# Patient Record
Sex: Male | Born: 1935 | Race: White | Hispanic: No | Marital: Married | Smoking: Former smoker
Health system: Southern US, Community
[De-identification: ages and names within clinical notes are randomized; demographics above are authoritative.]

## PROBLEM LIST (undated history)

## (undated) DIAGNOSIS — I10 Essential (primary) hypertension: Secondary | ICD-10-CM

## (undated) DIAGNOSIS — I2510 Atherosclerotic heart disease of native coronary artery without angina pectoris: Secondary | ICD-10-CM

## (undated) DIAGNOSIS — E039 Hypothyroidism, unspecified: Secondary | ICD-10-CM

## (undated) DIAGNOSIS — I219 Acute myocardial infarction, unspecified: Secondary | ICD-10-CM

## (undated) DIAGNOSIS — K219 Gastro-esophageal reflux disease without esophagitis: Secondary | ICD-10-CM

## (undated) DIAGNOSIS — E785 Hyperlipidemia, unspecified: Secondary | ICD-10-CM

## (undated) DIAGNOSIS — C189 Malignant neoplasm of colon, unspecified: Secondary | ICD-10-CM

## (undated) DIAGNOSIS — N200 Calculus of kidney: Secondary | ICD-10-CM

## (undated) DIAGNOSIS — Z9289 Personal history of other medical treatment: Secondary | ICD-10-CM

## (undated) DIAGNOSIS — Z9862 Peripheral vascular angioplasty status: Secondary | ICD-10-CM

## (undated) HISTORY — DX: Essential (primary) hypertension: I10

## (undated) HISTORY — DX: Hypothyroidism, unspecified: E03.9

## (undated) HISTORY — DX: Peripheral vascular angioplasty status: Z98.62

## (undated) HISTORY — PX: ROTATOR CUFF REPAIR: SHX139

## (undated) HISTORY — DX: Gastro-esophageal reflux disease without esophagitis: K21.9

## (undated) HISTORY — DX: Acute myocardial infarction, unspecified: I21.9

## (undated) HISTORY — DX: Atherosclerotic heart disease of native coronary artery without angina pectoris: I25.10

## (undated) HISTORY — DX: Hyperlipidemia, unspecified: E78.5

## (undated) HISTORY — DX: Malignant neoplasm of colon, unspecified: C18.9

## (undated) HISTORY — DX: Personal history of other medical treatment: Z92.89

## (undated) HISTORY — PX: CORONARY ARTERY BYPASS GRAFT: SHX141

## (undated) HISTORY — DX: Calculus of kidney: N20.0

---

## 1989-07-29 DIAGNOSIS — Z9862 Peripheral vascular angioplasty status: Secondary | ICD-10-CM

## 1989-07-29 DIAGNOSIS — I219 Acute myocardial infarction, unspecified: Secondary | ICD-10-CM

## 1989-07-29 HISTORY — DX: Peripheral vascular angioplasty status: Z98.62

## 1989-07-29 HISTORY — DX: Acute myocardial infarction, unspecified: I21.9

## 1991-11-29 HISTORY — PX: CARDIAC CATHETERIZATION: SHX172

## 1997-09-29 ENCOUNTER — Other Ambulatory Visit: Admission: RE | Admit: 1997-09-29 | Discharge: 1997-09-29 | Payer: Self-pay | Admitting: Internal Medicine

## 1999-08-07 ENCOUNTER — Ambulatory Visit (HOSPITAL_COMMUNITY): Admission: RE | Admit: 1999-08-07 | Discharge: 1999-08-07 | Payer: Self-pay | Admitting: Gastroenterology

## 2000-02-09 ENCOUNTER — Ambulatory Visit (HOSPITAL_COMMUNITY): Admission: RE | Admit: 2000-02-09 | Discharge: 2000-02-09 | Payer: Self-pay | Admitting: Gastroenterology

## 2000-04-30 HISTORY — PX: KNEE SURGERY: SHX244

## 2000-04-30 HISTORY — PX: OTHER SURGICAL HISTORY: SHX169

## 2001-01-28 ENCOUNTER — Ambulatory Visit (HOSPITAL_COMMUNITY): Admission: RE | Admit: 2001-01-28 | Discharge: 2001-01-28 | Payer: Self-pay | Admitting: Gastroenterology

## 2001-02-19 DIAGNOSIS — C189 Malignant neoplasm of colon, unspecified: Secondary | ICD-10-CM

## 2001-02-19 HISTORY — DX: Malignant neoplasm of colon, unspecified: C18.9

## 2001-02-19 HISTORY — PX: COLON SURGERY: SHX602

## 2002-02-23 ENCOUNTER — Ambulatory Visit (HOSPITAL_COMMUNITY): Admission: RE | Admit: 2002-02-23 | Discharge: 2002-02-23 | Payer: Self-pay | Admitting: Gastroenterology

## 2003-04-12 ENCOUNTER — Ambulatory Visit (HOSPITAL_COMMUNITY): Admission: RE | Admit: 2003-04-12 | Discharge: 2003-04-12 | Payer: Self-pay | Admitting: Internal Medicine

## 2004-12-13 ENCOUNTER — Emergency Department (HOSPITAL_COMMUNITY): Admission: EM | Admit: 2004-12-13 | Discharge: 2004-12-13 | Payer: Self-pay | Admitting: Emergency Medicine

## 2004-12-19 ENCOUNTER — Inpatient Hospital Stay (HOSPITAL_COMMUNITY): Admission: AD | Admit: 2004-12-19 | Discharge: 2004-12-25 | Payer: Self-pay | Admitting: Cardiovascular Disease

## 2004-12-20 HISTORY — PX: CARDIAC CATHETERIZATION: SHX172

## 2005-01-12 ENCOUNTER — Encounter: Admission: RE | Admit: 2005-01-12 | Discharge: 2005-01-12 | Payer: Self-pay | Admitting: Cardiothoracic Surgery

## 2005-01-18 ENCOUNTER — Encounter (HOSPITAL_COMMUNITY): Admission: RE | Admit: 2005-01-18 | Discharge: 2005-04-18 | Payer: Self-pay | Admitting: Cardiovascular Disease

## 2005-03-30 DIAGNOSIS — Z9289 Personal history of other medical treatment: Secondary | ICD-10-CM

## 2005-03-30 HISTORY — DX: Personal history of other medical treatment: Z92.89

## 2005-04-19 ENCOUNTER — Encounter (HOSPITAL_COMMUNITY): Admission: RE | Admit: 2005-04-19 | Discharge: 2005-05-20 | Payer: Self-pay | Admitting: Cardiovascular Disease

## 2005-06-12 ENCOUNTER — Ambulatory Visit (HOSPITAL_COMMUNITY): Admission: RE | Admit: 2005-06-12 | Discharge: 2005-06-12 | Payer: Self-pay | Admitting: Gastroenterology

## 2006-05-13 IMAGING — CR DG CHEST 1V PORT
1 series · 1 of 1 positions shown · non-contrast
Comparison: 12/20/04.

CLINICAL DATA: Post CABG for chest pain. 
 PORTABLE CHEST ? 1 VIEW:

[view not recorded]
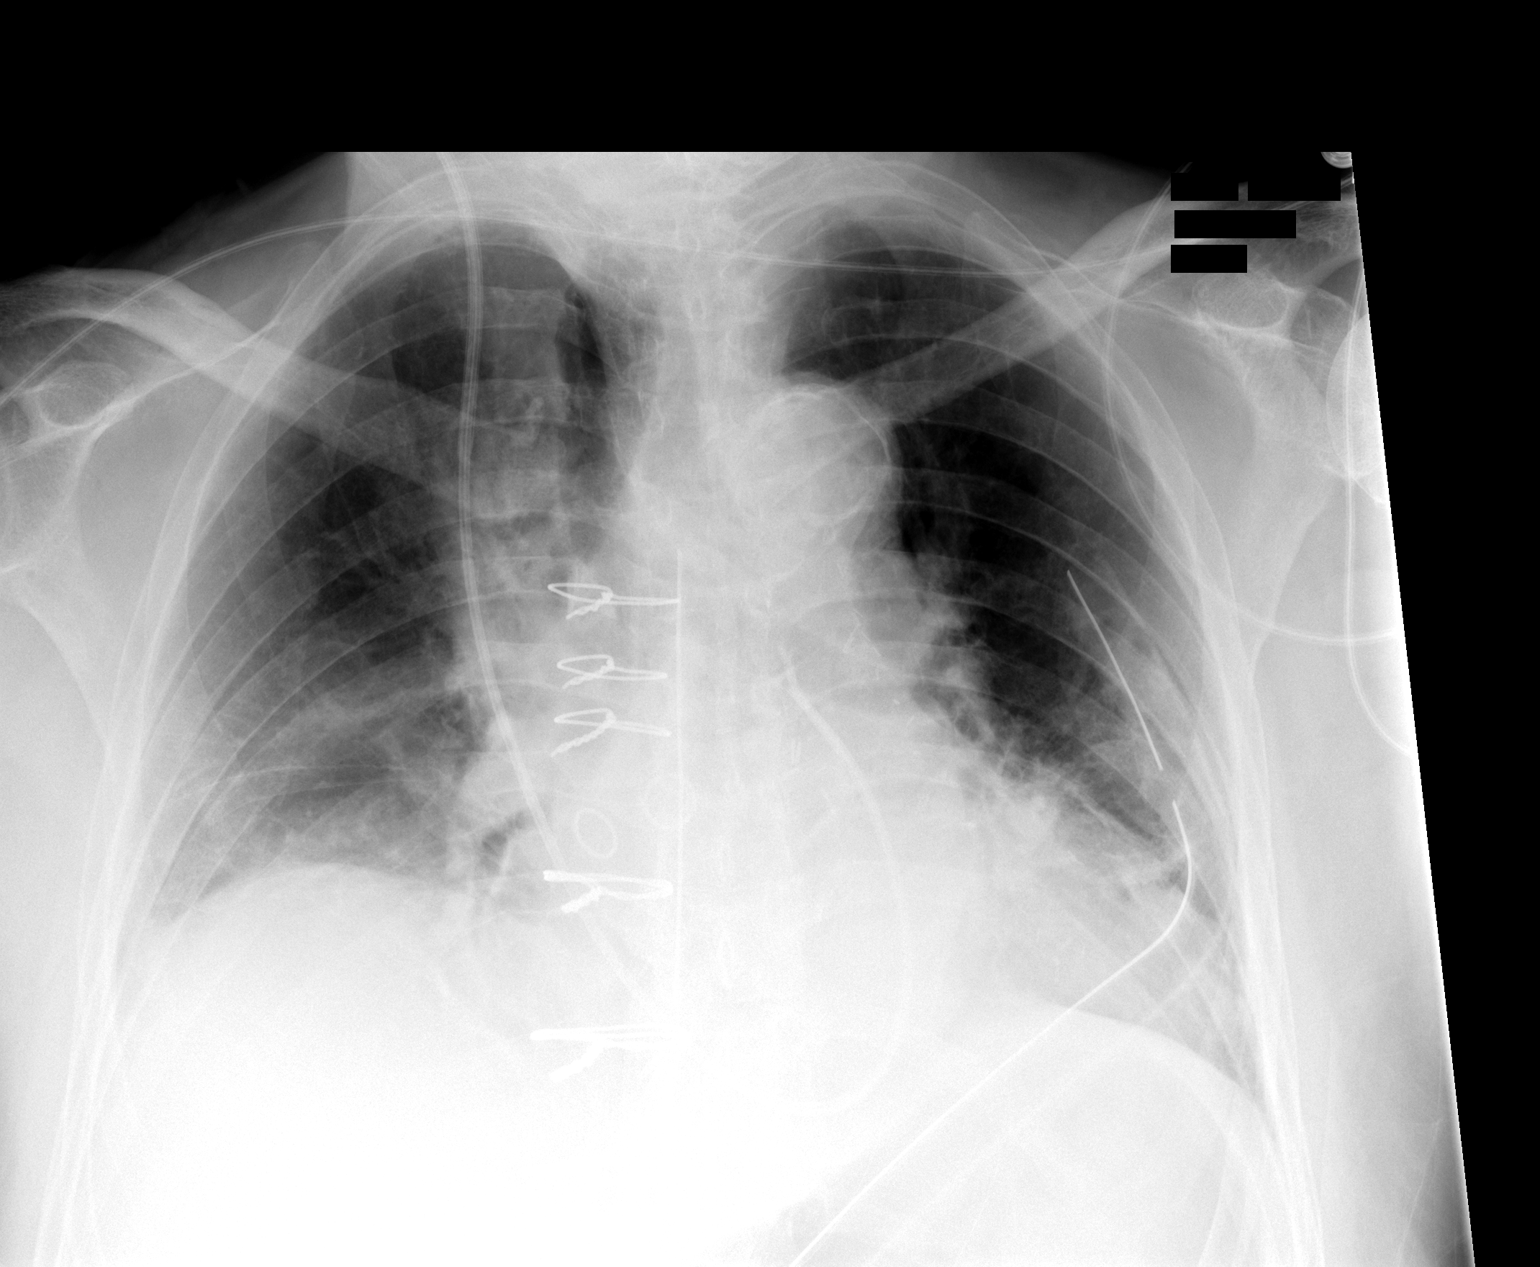

[1 of 1 positions shown; findings below may reference images not displayed]

FINDINGS: ET tube removed.  Other tubes and lines remain in place.  No definite right apical patient as was noted yesterday.
 Moderate bibasilar atelectasis postoperatively.  No definite heart failure.
IMPRESSION: 1.  ET tube removed. 
 2.  No definite pneumothorax. 
 3.  Moderate bibasilar postoperative atelectasis.

## 2006-05-14 IMAGING — CR DG CHEST 2V
2 series · 2 of 2 positions shown · non-contrast
Comparison: 12/21/04.

CLINICAL DATA: Status post CABG.
 CHEST ? 2 VIEW:

[view not recorded (1 of 2)]
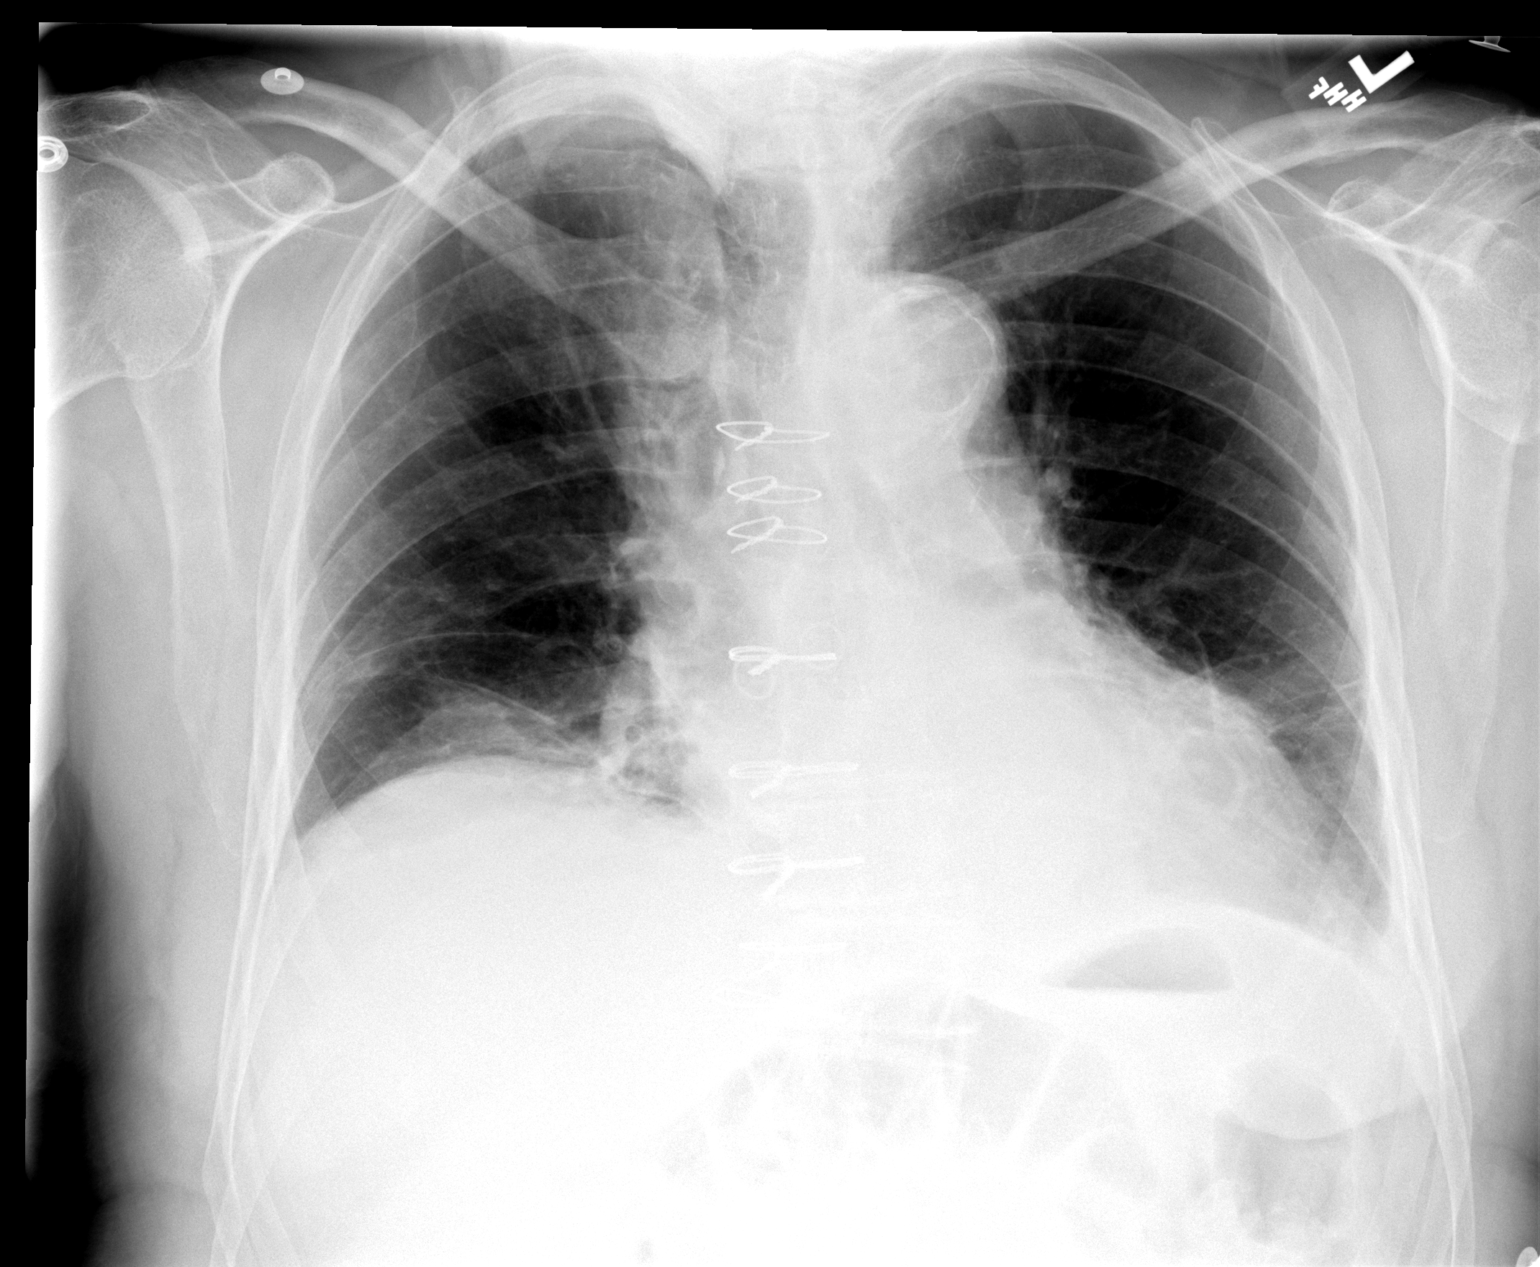

[view not recorded (2 of 2)]
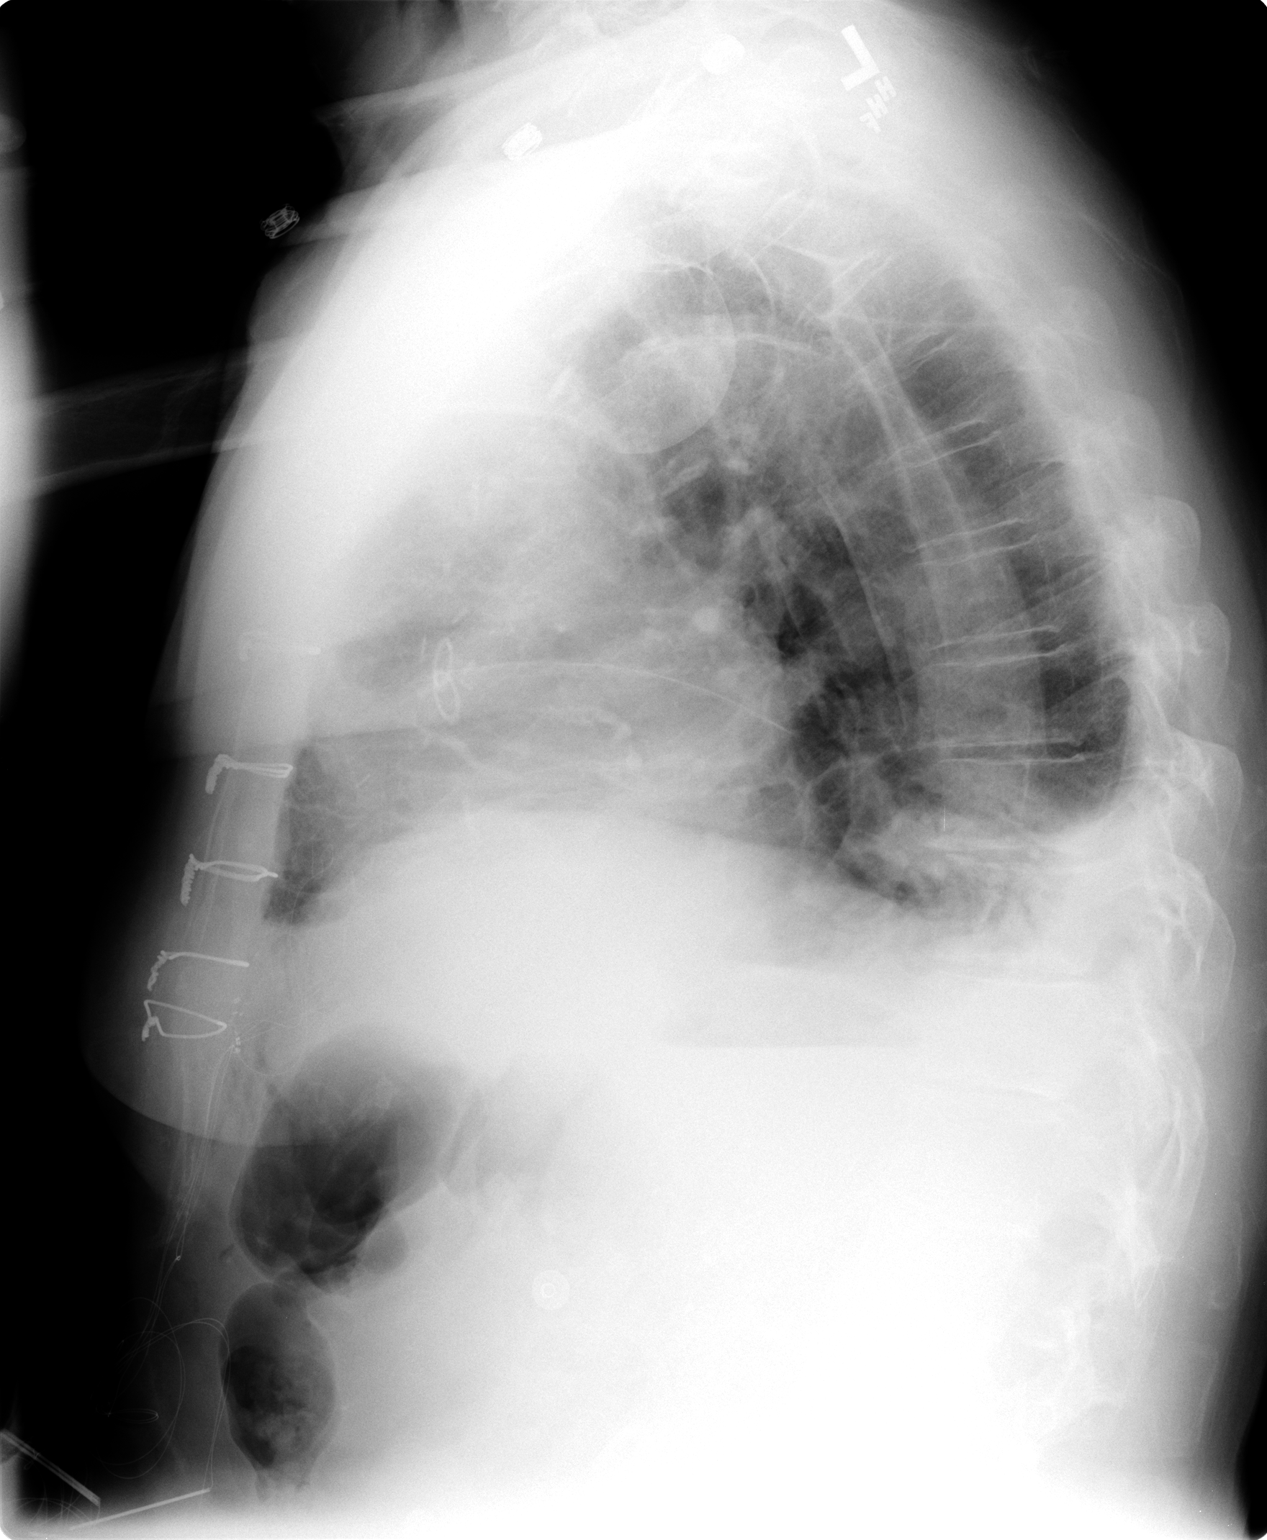

[2 of 2 positions shown; findings below may reference images not displayed]

FINDINGS: The patient is status post median sternotomy for CABG.  The Swan-Ganz catheter, mediastinal drain, and left-sided chest tube have all been removed.  Lung volumes remain low, accentuating the heart size.  Epicardial pacing wires remain in place.  There is some residual atelectasis at both lung bases.  A small amount of pleural fluid remain on the left.
IMPRESSION: 1.  Status post removal of support apparatus without complication.
 2.  Residual bibasilar atelectasis and left pleural effusion.

## 2009-09-28 DIAGNOSIS — Z9289 Personal history of other medical treatment: Secondary | ICD-10-CM

## 2009-09-28 HISTORY — DX: Personal history of other medical treatment: Z92.89

## 2010-07-31 LAB — HM COLONOSCOPY

## 2011-05-08 DIAGNOSIS — C44319 Basal cell carcinoma of skin of other parts of face: Secondary | ICD-10-CM | POA: Diagnosis not present

## 2011-05-31 DIAGNOSIS — E782 Mixed hyperlipidemia: Secondary | ICD-10-CM | POA: Diagnosis not present

## 2011-05-31 DIAGNOSIS — I10 Essential (primary) hypertension: Secondary | ICD-10-CM | POA: Diagnosis not present

## 2011-05-31 DIAGNOSIS — Z79899 Other long term (current) drug therapy: Secondary | ICD-10-CM | POA: Diagnosis not present

## 2011-05-31 DIAGNOSIS — E559 Vitamin D deficiency, unspecified: Secondary | ICD-10-CM | POA: Diagnosis not present

## 2011-06-14 DIAGNOSIS — H353190 Nonexudative age-related macular degeneration, unspecified eye, stage unspecified: Secondary | ICD-10-CM | POA: Diagnosis not present

## 2011-06-14 DIAGNOSIS — H409 Unspecified glaucoma: Secondary | ICD-10-CM | POA: Diagnosis not present

## 2011-06-14 DIAGNOSIS — H4010X0 Unspecified open-angle glaucoma, stage unspecified: Secondary | ICD-10-CM | POA: Diagnosis not present

## 2011-06-14 DIAGNOSIS — H353290 Exudative age-related macular degeneration, unspecified eye, stage unspecified: Secondary | ICD-10-CM | POA: Diagnosis not present

## 2011-06-15 DIAGNOSIS — H40029 Open angle with borderline findings, high risk, unspecified eye: Secondary | ICD-10-CM | POA: Diagnosis not present

## 2011-06-15 DIAGNOSIS — Z961 Presence of intraocular lens: Secondary | ICD-10-CM | POA: Diagnosis not present

## 2011-06-15 DIAGNOSIS — H3530 Unspecified macular degeneration: Secondary | ICD-10-CM | POA: Diagnosis not present

## 2011-06-15 DIAGNOSIS — H4011X0 Primary open-angle glaucoma, stage unspecified: Secondary | ICD-10-CM | POA: Diagnosis not present

## 2011-06-26 DIAGNOSIS — H409 Unspecified glaucoma: Secondary | ICD-10-CM | POA: Diagnosis not present

## 2011-06-26 DIAGNOSIS — H4011X0 Primary open-angle glaucoma, stage unspecified: Secondary | ICD-10-CM | POA: Diagnosis not present

## 2011-06-26 DIAGNOSIS — H3530 Unspecified macular degeneration: Secondary | ICD-10-CM | POA: Diagnosis not present

## 2011-06-26 DIAGNOSIS — H04129 Dry eye syndrome of unspecified lacrimal gland: Secondary | ICD-10-CM | POA: Diagnosis not present

## 2011-07-17 DIAGNOSIS — H4011X0 Primary open-angle glaucoma, stage unspecified: Secondary | ICD-10-CM | POA: Diagnosis not present

## 2011-07-17 DIAGNOSIS — H409 Unspecified glaucoma: Secondary | ICD-10-CM | POA: Diagnosis not present

## 2011-07-24 DIAGNOSIS — H40029 Open angle with borderline findings, high risk, unspecified eye: Secondary | ICD-10-CM | POA: Diagnosis not present

## 2011-07-24 DIAGNOSIS — H409 Unspecified glaucoma: Secondary | ICD-10-CM | POA: Diagnosis not present

## 2011-07-24 DIAGNOSIS — H4011X0 Primary open-angle glaucoma, stage unspecified: Secondary | ICD-10-CM | POA: Diagnosis not present

## 2011-08-09 DIAGNOSIS — D485 Neoplasm of uncertain behavior of skin: Secondary | ICD-10-CM | POA: Diagnosis not present

## 2011-08-09 DIAGNOSIS — C4441 Basal cell carcinoma of skin of scalp and neck: Secondary | ICD-10-CM | POA: Diagnosis not present

## 2011-08-09 DIAGNOSIS — L821 Other seborrheic keratosis: Secondary | ICD-10-CM | POA: Diagnosis not present

## 2011-08-09 DIAGNOSIS — B079 Viral wart, unspecified: Secondary | ICD-10-CM | POA: Diagnosis not present

## 2011-08-09 DIAGNOSIS — C44320 Squamous cell carcinoma of skin of unspecified parts of face: Secondary | ICD-10-CM | POA: Diagnosis not present

## 2011-08-09 DIAGNOSIS — Z85828 Personal history of other malignant neoplasm of skin: Secondary | ICD-10-CM | POA: Diagnosis not present

## 2011-08-09 DIAGNOSIS — L570 Actinic keratosis: Secondary | ICD-10-CM | POA: Diagnosis not present

## 2011-08-14 DIAGNOSIS — Z79899 Other long term (current) drug therapy: Secondary | ICD-10-CM | POA: Diagnosis not present

## 2011-08-14 DIAGNOSIS — Z125 Encounter for screening for malignant neoplasm of prostate: Secondary | ICD-10-CM | POA: Diagnosis not present

## 2011-08-14 DIAGNOSIS — N3000 Acute cystitis without hematuria: Secondary | ICD-10-CM | POA: Diagnosis not present

## 2011-08-14 DIAGNOSIS — I10 Essential (primary) hypertension: Secondary | ICD-10-CM | POA: Diagnosis not present

## 2011-08-14 DIAGNOSIS — E559 Vitamin D deficiency, unspecified: Secondary | ICD-10-CM | POA: Diagnosis not present

## 2011-08-14 DIAGNOSIS — R7309 Other abnormal glucose: Secondary | ICD-10-CM | POA: Diagnosis not present

## 2011-08-14 DIAGNOSIS — E782 Mixed hyperlipidemia: Secondary | ICD-10-CM | POA: Diagnosis not present

## 2011-08-14 DIAGNOSIS — Z1212 Encounter for screening for malignant neoplasm of rectum: Secondary | ICD-10-CM | POA: Diagnosis not present

## 2011-08-14 DIAGNOSIS — Z23 Encounter for immunization: Secondary | ICD-10-CM | POA: Diagnosis not present

## 2011-08-16 DIAGNOSIS — H353190 Nonexudative age-related macular degeneration, unspecified eye, stage unspecified: Secondary | ICD-10-CM | POA: Diagnosis not present

## 2011-08-16 DIAGNOSIS — H409 Unspecified glaucoma: Secondary | ICD-10-CM | POA: Diagnosis not present

## 2011-08-16 DIAGNOSIS — H4010X0 Unspecified open-angle glaucoma, stage unspecified: Secondary | ICD-10-CM | POA: Diagnosis not present

## 2011-08-16 DIAGNOSIS — H353290 Exudative age-related macular degeneration, unspecified eye, stage unspecified: Secondary | ICD-10-CM | POA: Diagnosis not present

## 2011-09-11 DIAGNOSIS — C4441 Basal cell carcinoma of skin of scalp and neck: Secondary | ICD-10-CM | POA: Diagnosis not present

## 2011-09-11 DIAGNOSIS — C44320 Squamous cell carcinoma of skin of unspecified parts of face: Secondary | ICD-10-CM | POA: Diagnosis not present

## 2011-09-11 DIAGNOSIS — C44319 Basal cell carcinoma of skin of other parts of face: Secondary | ICD-10-CM | POA: Diagnosis not present

## 2011-09-25 DIAGNOSIS — Z961 Presence of intraocular lens: Secondary | ICD-10-CM | POA: Diagnosis not present

## 2011-09-25 DIAGNOSIS — H40029 Open angle with borderline findings, high risk, unspecified eye: Secondary | ICD-10-CM | POA: Diagnosis not present

## 2011-09-25 DIAGNOSIS — H4011X0 Primary open-angle glaucoma, stage unspecified: Secondary | ICD-10-CM | POA: Diagnosis not present

## 2011-09-25 DIAGNOSIS — H3530 Unspecified macular degeneration: Secondary | ICD-10-CM | POA: Diagnosis not present

## 2011-10-25 DIAGNOSIS — H353190 Nonexudative age-related macular degeneration, unspecified eye, stage unspecified: Secondary | ICD-10-CM | POA: Diagnosis not present

## 2011-10-25 DIAGNOSIS — Z961 Presence of intraocular lens: Secondary | ICD-10-CM | POA: Diagnosis not present

## 2011-10-25 DIAGNOSIS — H409 Unspecified glaucoma: Secondary | ICD-10-CM | POA: Diagnosis not present

## 2011-10-25 DIAGNOSIS — H353290 Exudative age-related macular degeneration, unspecified eye, stage unspecified: Secondary | ICD-10-CM | POA: Diagnosis not present

## 2011-10-25 DIAGNOSIS — H4010X0 Unspecified open-angle glaucoma, stage unspecified: Secondary | ICD-10-CM | POA: Diagnosis not present

## 2011-11-14 DIAGNOSIS — R7309 Other abnormal glucose: Secondary | ICD-10-CM | POA: Diagnosis not present

## 2011-11-14 DIAGNOSIS — E559 Vitamin D deficiency, unspecified: Secondary | ICD-10-CM | POA: Diagnosis not present

## 2011-11-14 DIAGNOSIS — N3000 Acute cystitis without hematuria: Secondary | ICD-10-CM | POA: Diagnosis not present

## 2011-11-14 DIAGNOSIS — E782 Mixed hyperlipidemia: Secondary | ICD-10-CM | POA: Diagnosis not present

## 2011-11-14 DIAGNOSIS — Z79899 Other long term (current) drug therapy: Secondary | ICD-10-CM | POA: Diagnosis not present

## 2011-11-14 DIAGNOSIS — I10 Essential (primary) hypertension: Secondary | ICD-10-CM | POA: Diagnosis not present

## 2011-11-30 DIAGNOSIS — I2510 Atherosclerotic heart disease of native coronary artery without angina pectoris: Secondary | ICD-10-CM | POA: Diagnosis not present

## 2011-11-30 DIAGNOSIS — Z951 Presence of aortocoronary bypass graft: Secondary | ICD-10-CM | POA: Diagnosis not present

## 2011-11-30 DIAGNOSIS — E782 Mixed hyperlipidemia: Secondary | ICD-10-CM | POA: Diagnosis not present

## 2011-11-30 DIAGNOSIS — I10 Essential (primary) hypertension: Secondary | ICD-10-CM | POA: Diagnosis not present

## 2011-12-05 DIAGNOSIS — I2510 Atherosclerotic heart disease of native coronary artery without angina pectoris: Secondary | ICD-10-CM | POA: Diagnosis not present

## 2011-12-05 DIAGNOSIS — E782 Mixed hyperlipidemia: Secondary | ICD-10-CM | POA: Diagnosis not present

## 2011-12-05 DIAGNOSIS — I10 Essential (primary) hypertension: Secondary | ICD-10-CM | POA: Diagnosis not present

## 2012-01-21 DIAGNOSIS — Z23 Encounter for immunization: Secondary | ICD-10-CM | POA: Diagnosis not present

## 2012-01-23 DIAGNOSIS — L723 Sebaceous cyst: Secondary | ICD-10-CM | POA: Diagnosis not present

## 2012-01-24 DIAGNOSIS — H353290 Exudative age-related macular degeneration, unspecified eye, stage unspecified: Secondary | ICD-10-CM | POA: Diagnosis not present

## 2012-01-24 DIAGNOSIS — H409 Unspecified glaucoma: Secondary | ICD-10-CM | POA: Diagnosis not present

## 2012-01-24 DIAGNOSIS — H353190 Nonexudative age-related macular degeneration, unspecified eye, stage unspecified: Secondary | ICD-10-CM | POA: Diagnosis not present

## 2012-01-24 DIAGNOSIS — H4010X0 Unspecified open-angle glaucoma, stage unspecified: Secondary | ICD-10-CM | POA: Diagnosis not present

## 2012-02-07 DIAGNOSIS — Z85828 Personal history of other malignant neoplasm of skin: Secondary | ICD-10-CM | POA: Diagnosis not present

## 2012-02-07 DIAGNOSIS — L821 Other seborrheic keratosis: Secondary | ICD-10-CM | POA: Diagnosis not present

## 2012-02-07 DIAGNOSIS — D485 Neoplasm of uncertain behavior of skin: Secondary | ICD-10-CM | POA: Diagnosis not present

## 2012-02-07 DIAGNOSIS — L570 Actinic keratosis: Secondary | ICD-10-CM | POA: Diagnosis not present

## 2012-02-07 DIAGNOSIS — C44319 Basal cell carcinoma of skin of other parts of face: Secondary | ICD-10-CM | POA: Diagnosis not present

## 2012-02-14 DIAGNOSIS — H6120 Impacted cerumen, unspecified ear: Secondary | ICD-10-CM | POA: Diagnosis not present

## 2012-02-14 DIAGNOSIS — Z79899 Other long term (current) drug therapy: Secondary | ICD-10-CM | POA: Diagnosis not present

## 2012-02-14 DIAGNOSIS — E782 Mixed hyperlipidemia: Secondary | ICD-10-CM | POA: Diagnosis not present

## 2012-02-14 DIAGNOSIS — I10 Essential (primary) hypertension: Secondary | ICD-10-CM | POA: Diagnosis not present

## 2012-02-14 DIAGNOSIS — E559 Vitamin D deficiency, unspecified: Secondary | ICD-10-CM | POA: Diagnosis not present

## 2012-02-14 DIAGNOSIS — E039 Hypothyroidism, unspecified: Secondary | ICD-10-CM | POA: Diagnosis not present

## 2012-02-14 DIAGNOSIS — R7309 Other abnormal glucose: Secondary | ICD-10-CM | POA: Diagnosis not present

## 2012-02-20 DIAGNOSIS — H6120 Impacted cerumen, unspecified ear: Secondary | ICD-10-CM | POA: Diagnosis not present

## 2012-04-08 DIAGNOSIS — C44319 Basal cell carcinoma of skin of other parts of face: Secondary | ICD-10-CM | POA: Diagnosis not present

## 2012-04-08 DIAGNOSIS — C44111 Basal cell carcinoma of skin of unspecified eyelid, including canthus: Secondary | ICD-10-CM | POA: Diagnosis not present

## 2012-04-17 DIAGNOSIS — H353190 Nonexudative age-related macular degeneration, unspecified eye, stage unspecified: Secondary | ICD-10-CM | POA: Diagnosis not present

## 2012-04-17 DIAGNOSIS — H4010X0 Unspecified open-angle glaucoma, stage unspecified: Secondary | ICD-10-CM | POA: Diagnosis not present

## 2012-04-17 DIAGNOSIS — H353290 Exudative age-related macular degeneration, unspecified eye, stage unspecified: Secondary | ICD-10-CM | POA: Diagnosis not present

## 2012-04-17 DIAGNOSIS — Z961 Presence of intraocular lens: Secondary | ICD-10-CM | POA: Diagnosis not present

## 2012-04-17 DIAGNOSIS — H409 Unspecified glaucoma: Secondary | ICD-10-CM | POA: Diagnosis not present

## 2012-05-21 DIAGNOSIS — I10 Essential (primary) hypertension: Secondary | ICD-10-CM | POA: Diagnosis not present

## 2012-05-21 DIAGNOSIS — E559 Vitamin D deficiency, unspecified: Secondary | ICD-10-CM | POA: Diagnosis not present

## 2012-05-21 DIAGNOSIS — Z79899 Other long term (current) drug therapy: Secondary | ICD-10-CM | POA: Diagnosis not present

## 2012-05-21 DIAGNOSIS — E782 Mixed hyperlipidemia: Secondary | ICD-10-CM | POA: Diagnosis not present

## 2012-07-17 DIAGNOSIS — H353290 Exudative age-related macular degeneration, unspecified eye, stage unspecified: Secondary | ICD-10-CM | POA: Diagnosis not present

## 2012-07-17 DIAGNOSIS — H4010X0 Unspecified open-angle glaucoma, stage unspecified: Secondary | ICD-10-CM | POA: Diagnosis not present

## 2012-07-17 DIAGNOSIS — H353190 Nonexudative age-related macular degeneration, unspecified eye, stage unspecified: Secondary | ICD-10-CM | POA: Diagnosis not present

## 2012-07-17 DIAGNOSIS — Z961 Presence of intraocular lens: Secondary | ICD-10-CM | POA: Diagnosis not present

## 2012-07-17 DIAGNOSIS — H409 Unspecified glaucoma: Secondary | ICD-10-CM | POA: Diagnosis not present

## 2012-08-05 DIAGNOSIS — Z85828 Personal history of other malignant neoplasm of skin: Secondary | ICD-10-CM | POA: Diagnosis not present

## 2012-08-05 DIAGNOSIS — L570 Actinic keratosis: Secondary | ICD-10-CM | POA: Diagnosis not present

## 2012-08-05 DIAGNOSIS — L821 Other seborrheic keratosis: Secondary | ICD-10-CM | POA: Diagnosis not present

## 2012-08-05 DIAGNOSIS — D1801 Hemangioma of skin and subcutaneous tissue: Secondary | ICD-10-CM | POA: Diagnosis not present

## 2012-09-19 DIAGNOSIS — R7309 Other abnormal glucose: Secondary | ICD-10-CM | POA: Diagnosis not present

## 2012-09-19 DIAGNOSIS — E782 Mixed hyperlipidemia: Secondary | ICD-10-CM | POA: Diagnosis not present

## 2012-09-19 DIAGNOSIS — Z23 Encounter for immunization: Secondary | ICD-10-CM | POA: Diagnosis not present

## 2012-09-19 DIAGNOSIS — N320 Bladder-neck obstruction: Secondary | ICD-10-CM | POA: Diagnosis not present

## 2012-09-19 DIAGNOSIS — E756 Lipid storage disorder, unspecified: Secondary | ICD-10-CM | POA: Diagnosis not present

## 2012-09-19 DIAGNOSIS — E559 Vitamin D deficiency, unspecified: Secondary | ICD-10-CM | POA: Diagnosis not present

## 2012-09-19 DIAGNOSIS — I10 Essential (primary) hypertension: Secondary | ICD-10-CM | POA: Diagnosis not present

## 2012-09-19 DIAGNOSIS — Z79899 Other long term (current) drug therapy: Secondary | ICD-10-CM | POA: Diagnosis not present

## 2012-09-19 DIAGNOSIS — Z125 Encounter for screening for malignant neoplasm of prostate: Secondary | ICD-10-CM | POA: Diagnosis not present

## 2012-09-19 DIAGNOSIS — Z1212 Encounter for screening for malignant neoplasm of rectum: Secondary | ICD-10-CM | POA: Diagnosis not present

## 2012-10-17 DIAGNOSIS — Z961 Presence of intraocular lens: Secondary | ICD-10-CM | POA: Diagnosis not present

## 2012-10-17 DIAGNOSIS — H4011X0 Primary open-angle glaucoma, stage unspecified: Secondary | ICD-10-CM | POA: Diagnosis not present

## 2012-10-17 DIAGNOSIS — H3530 Unspecified macular degeneration: Secondary | ICD-10-CM | POA: Diagnosis not present

## 2012-10-21 DIAGNOSIS — E039 Hypothyroidism, unspecified: Secondary | ICD-10-CM | POA: Diagnosis not present

## 2012-10-30 DIAGNOSIS — H353290 Exudative age-related macular degeneration, unspecified eye, stage unspecified: Secondary | ICD-10-CM | POA: Diagnosis not present

## 2012-10-30 DIAGNOSIS — H31019 Macula scars of posterior pole (postinflammatory) (post-traumatic), unspecified eye: Secondary | ICD-10-CM | POA: Diagnosis not present

## 2012-10-30 DIAGNOSIS — H409 Unspecified glaucoma: Secondary | ICD-10-CM | POA: Diagnosis not present

## 2012-10-30 DIAGNOSIS — Z961 Presence of intraocular lens: Secondary | ICD-10-CM | POA: Diagnosis not present

## 2012-10-30 DIAGNOSIS — H4010X0 Unspecified open-angle glaucoma, stage unspecified: Secondary | ICD-10-CM | POA: Diagnosis not present

## 2012-10-30 DIAGNOSIS — H353190 Nonexudative age-related macular degeneration, unspecified eye, stage unspecified: Secondary | ICD-10-CM | POA: Diagnosis not present

## 2012-12-19 ENCOUNTER — Encounter: Payer: Self-pay | Admitting: *Deleted

## 2012-12-19 ENCOUNTER — Encounter: Payer: Self-pay | Admitting: Cardiovascular Disease

## 2012-12-22 ENCOUNTER — Ambulatory Visit: Payer: Self-pay | Admitting: Cardiovascular Disease

## 2013-01-19 ENCOUNTER — Telehealth: Payer: Self-pay | Admitting: Cardiovascular Disease

## 2013-01-19 NOTE — Telephone Encounter (Signed)
Cx this message-need an appt instead.

## 2013-01-20 DIAGNOSIS — I10 Essential (primary) hypertension: Secondary | ICD-10-CM | POA: Diagnosis not present

## 2013-01-20 DIAGNOSIS — Z23 Encounter for immunization: Secondary | ICD-10-CM | POA: Diagnosis not present

## 2013-01-20 DIAGNOSIS — E538 Deficiency of other specified B group vitamins: Secondary | ICD-10-CM | POA: Diagnosis not present

## 2013-01-20 DIAGNOSIS — E559 Vitamin D deficiency, unspecified: Secondary | ICD-10-CM | POA: Diagnosis not present

## 2013-01-20 DIAGNOSIS — Z79899 Other long term (current) drug therapy: Secondary | ICD-10-CM | POA: Diagnosis not present

## 2013-01-20 DIAGNOSIS — E782 Mixed hyperlipidemia: Secondary | ICD-10-CM | POA: Diagnosis not present

## 2013-02-10 DIAGNOSIS — L570 Actinic keratosis: Secondary | ICD-10-CM | POA: Diagnosis not present

## 2013-02-12 ENCOUNTER — Ambulatory Visit: Payer: Self-pay | Admitting: Cardiovascular Disease

## 2013-02-23 DIAGNOSIS — N419 Inflammatory disease of prostate, unspecified: Secondary | ICD-10-CM | POA: Diagnosis not present

## 2013-03-12 DIAGNOSIS — Z961 Presence of intraocular lens: Secondary | ICD-10-CM | POA: Diagnosis not present

## 2013-03-12 DIAGNOSIS — H4010X0 Unspecified open-angle glaucoma, stage unspecified: Secondary | ICD-10-CM | POA: Diagnosis not present

## 2013-03-12 DIAGNOSIS — H353290 Exudative age-related macular degeneration, unspecified eye, stage unspecified: Secondary | ICD-10-CM | POA: Diagnosis not present

## 2013-03-12 DIAGNOSIS — H409 Unspecified glaucoma: Secondary | ICD-10-CM | POA: Diagnosis not present

## 2013-03-12 DIAGNOSIS — H353190 Nonexudative age-related macular degeneration, unspecified eye, stage unspecified: Secondary | ICD-10-CM | POA: Diagnosis not present

## 2013-03-15 ENCOUNTER — Other Ambulatory Visit: Payer: Self-pay | Admitting: Internal Medicine

## 2013-03-30 ENCOUNTER — Encounter: Payer: Self-pay | Admitting: Internal Medicine

## 2013-03-30 ENCOUNTER — Ambulatory Visit (INDEPENDENT_AMBULATORY_CARE_PROVIDER_SITE_OTHER): Payer: Medicare Other | Admitting: Physician Assistant

## 2013-03-30 ENCOUNTER — Ambulatory Visit: Payer: Self-pay | Admitting: Internal Medicine

## 2013-03-30 VITALS — BP 126/74 | HR 52 | Temp 98.8°F | Resp 18 | Ht 70.0 in | Wt 174.6 lb

## 2013-03-30 DIAGNOSIS — E039 Hypothyroidism, unspecified: Secondary | ICD-10-CM | POA: Insufficient documentation

## 2013-03-30 DIAGNOSIS — E782 Mixed hyperlipidemia: Secondary | ICD-10-CM | POA: Diagnosis not present

## 2013-03-30 DIAGNOSIS — I2510 Atherosclerotic heart disease of native coronary artery without angina pectoris: Secondary | ICD-10-CM

## 2013-03-30 DIAGNOSIS — R7309 Other abnormal glucose: Secondary | ICD-10-CM

## 2013-03-30 DIAGNOSIS — E559 Vitamin D deficiency, unspecified: Secondary | ICD-10-CM

## 2013-03-30 DIAGNOSIS — R7303 Prediabetes: Secondary | ICD-10-CM | POA: Insufficient documentation

## 2013-03-30 DIAGNOSIS — N3000 Acute cystitis without hematuria: Secondary | ICD-10-CM

## 2013-03-30 DIAGNOSIS — M159 Polyosteoarthritis, unspecified: Secondary | ICD-10-CM

## 2013-03-30 DIAGNOSIS — Z79899 Other long term (current) drug therapy: Secondary | ICD-10-CM

## 2013-03-30 DIAGNOSIS — I10 Essential (primary) hypertension: Secondary | ICD-10-CM

## 2013-03-30 LAB — LIPID PANEL
Cholesterol: 136 mg/dL (ref 0–200)
HDL: 52 mg/dL (ref >39)
LDL Cholesterol: 67 mg/dL (ref 0–99)
Total CHOL/HDL Ratio: 2.6 Ratio
Triglycerides: 85 mg/dL (ref <150)
VLDL: 17 mg/dL (ref 0–40)

## 2013-03-30 LAB — CBC WITH DIFFERENTIAL/PLATELET
Basophils Absolute: 0 10*3/uL (ref 0.0–0.1)
Basophils Relative: 1 % (ref 0–1)
Eosinophils Absolute: 0.2 10*3/uL (ref 0.0–0.7)
Eosinophils Relative: 3 % (ref 0–5)
HCT: 41 % (ref 39.0–52.0)
Hemoglobin: 14.1 g/dL (ref 13.0–17.0)
Lymphocytes Relative: 41 % (ref 12–46)
Lymphs Abs: 2.4 10*3/uL (ref 0.7–4.0)
MCH: 31.1 pg (ref 26.0–34.0)
MCHC: 34.4 g/dL (ref 30.0–36.0)
MCV: 90.5 fL (ref 78.0–100.0)
Monocytes Absolute: 0.6 10*3/uL (ref 0.1–1.0)
Monocytes Relative: 10 % (ref 3–12)
Neutro Abs: 2.7 10*3/uL (ref 1.7–7.7)
Neutrophils Relative %: 45 % (ref 43–77)
Platelets: 180 10*3/uL (ref 150–400)
RBC: 4.53 MIL/uL (ref 4.22–5.81)
RDW: 13.8 % (ref 11.5–15.5)
WBC: 5.9 10*3/uL (ref 4.0–10.5)

## 2013-03-30 LAB — BASIC METABOLIC PANEL WITH GFR
BUN: 25 mg/dL — ABNORMAL HIGH (ref 6–23)
CO2: 28 mEq/L (ref 19–32)
Calcium: 10 mg/dL (ref 8.4–10.5)
Chloride: 104 mEq/L (ref 96–112)
Creat: 1.03 mg/dL (ref 0.50–1.35)
GFR, Est African American: 74 mL/min
GFR, Est Non African American: 64 mL/min
Glucose, Bld: 79 mg/dL (ref 70–99)
Potassium: 4.3 mEq/L (ref 3.5–5.3)
Sodium: 139 mEq/L (ref 135–145)

## 2013-03-30 LAB — HEPATIC FUNCTION PANEL
ALT: 18 U/L (ref 0–53)
AST: 23 U/L (ref 0–37)
Albumin: 3.9 g/dL (ref 3.5–5.2)
Alkaline Phosphatase: 83 U/L (ref 39–117)
Bilirubin, Direct: 0.2 mg/dL (ref 0.0–0.3)
Indirect Bilirubin: 0.4 mg/dL (ref 0.0–0.9)
Total Bilirubin: 0.6 mg/dL (ref 0.3–1.2)
Total Protein: 6.3 g/dL (ref 6.0–8.3)

## 2013-03-30 LAB — TSH: TSH: 3.799 u[IU]/mL (ref 0.350–4.500)

## 2013-03-30 LAB — MAGNESIUM: Magnesium: 1.9 mg/dL (ref 1.5–2.5)

## 2013-03-30 NOTE — Progress Notes (Signed)
Patient ID: Martin Massey, male   DOB: 1920-07-09, 79 y.o.   MRN: [id number]   This very nice66 yo MWM presents for 3 month follow up with Hypertension, Hyperlipidemia, pre-diabetes and Vitamin D deficiency.    BP has been controlled at home.  Patient denies any cardiac type chest pain, palpitations, dyspnea/orthopnea/PND, dizziness, claudication, or dependent edema.   Hyperlipidemia is controlled with diet & meds. Last cholesterol was LDL 62 . Patient denies myalgias or other med SE's.    Further, Patient has history of vitamin D deficiency with last vitamin D of 82 . Patient supplements vitamin without any suspected side-effects.  Current Outpatient Prescriptions on File Prior to Visit  Medication Sig Dispense Refill  . aspirin 81 MG tablet Take 81 mg by mouth daily.      Marland Kitchen atorvastatin (LIPITOR) 20 MG tablet Take 20 mg by mouth daily.      . Cholecalciferol (VITAMIN D) 2000 UNITS CAPS Take by mouth. 2 in the morning and 1 at night      . enalapril (VASOTEC) 20 MG tablet TAKE 1 TABLET DAILY FOR    BLOOD PRESSURE  90 tablet  0  . finasteride (PROSCAR) 5 MG tablet Take 5 mg by mouth daily.      . hydrochlorothiazide (HYDRODIURIL) 25 MG tablet Take 25 mg by mouth daily.      Marland Kitchen levothyroxine (SYNTHROID, LEVOTHROID) 50 MCG tablet 50 mcg. 1 trab on mon, wed, and fri and 1 1/2 on tu, th, sat and sun      . Multiple Vitamin (MULTIVITAMIN WITH MINERALS) TABS tablet Take 1 tablet by mouth daily.      Marland Kitchen NIFEdipine (PROCARDIA-XL/ADALAT CC) 30 MG 24 hr tablet Take 30 mg by mouth daily.      . ranitidine (ZANTAC) 150 MG capsule Take 150 mg by mouth as needed for heartburn.       No current facility-administered medications on file prior to visit.     Allergies  Allergen Reactions  . Capoten [Captopril] Cough  . Lopressor [Metoprolol] Other (See Comments)    bradycardia    PMHx:   Past Medical History  Diagnosis Date  . CAD (coronary artery disease)     CABG x3  . Hypertension   .  Hyperlipidemia   . History of stress test 11/30/2011    was nonischemic and unchanged from prior study  . MI (myocardial infarction) 07/1989    Non-Q wave   . H/O angioplasty 07/1989    with PTCA of 2 vessels  . Colon cancer 02/19/01  . Kidney stones   . Hypothyroidism   . GERD (gastroesophageal reflux disease)   . History of echocardiogram 12/06    EF 50%-55% which was normal with mild to moderate MR and a cardiolite which showed inferior scar versus diaphragmatic attenuation   . History of stress test 06.2011    showed diastolic continuation without ischemia.    FHx:    Reviewed / unchanged  SHx:    Reviewed / unchanged  Systems Review: Constitutional: Denies fever, chills, wt changes, headaches, insomnia, fatigue, night sweats, change in appetite. Eyes: Denies redness, blurred vision, diplopia, discharge, itchy, watery eyes.  ENT: Denies discharge, congestion, post nasal drip, epistaxis, sore throat, earache, hearing loss, dental pain, tinnitus, vertigo, sinus pain, snoring.  CV: Denies chest pain, palpitations, irregular heartbeat, syncope, dyspnea, diaphoresis, orthopnea, PND, claudication, edema. Respiratory: denies cough, dyspnea, DOE, pleurisy, hoarseness, laryngitis, wheezing.  Gastrointestinal: Denies dysphagia, odynophagia, heartburn, reflux, water brash, abdominal pain  or cramps, nausea, vomiting, bloating, diarrhea, constipation, hematemesis, melena, hematochezia,  Hemorrhoids. Genitourinary: Denies dysuria, frequency, urgency, nocturia, hesitancy, discharge, hematuria, flank pain. Musculoskeletal: Denies arthralgias, myalgias, stiffness, jt. swelling, pain, limp, strain/sprain.  Skin: Denies pruritus, rash, hives, warts, acne, eczema, change in skin lesion(s). Neuro: No weakness, tremor, incoordination, spasms, paresthesia, or pain. Psychiatric: Denies confusion, memory loss, or sensory loss. Endo: Denies change in weight, skin, hair change.  Heme/Lymph: No excessive  bleeding, bruising, orenlarged lymph nodes.  Filed Vitals:   03/30/13 1226  BP: 126/74  Pulse: 52  Temp: 98.8 F (37.1 C)  Resp: 18    Estimated body mass index is 25.05 kg/(m^2) as calculated from the following:   Height as of this encounter: 5\' 10"  (1.778 m).   Weight as of this encounter: 174 lb 9.6 oz (79.198 kg).  On Exam: Appears well nourished - in no distress. Eyes: PERRLA, EOMs, conjunctiva no swelling or erythema. Sinuses: No frontal/maxillary tenderness ENT/Mouth: EAC's clear, TM's nl w/o erythema, bulging. Nares clear w/o erythema, swelling, exudates. Oropharynx clear without erythema or exudates. Oral hygiene is good. Tongue normal, non obstructing. Hearing intact.  Neck: Supple. Thyroid nl. Car 2+/2+ without bruits, nodes or JVD. Chest: Respirations nl with BS clear & equal w/o rales, rhonchi, wheezing or stridor.  Cor: Heart sounds normal w/ regular rate and rhythm with 3/6 systolic murmur without gallops, clicks, or rubs. Peripheral pulses normal and equal  without edema.  Abdomen: Soft & bowel sounds normal. Non-tender w/o guarding, rebound, hernias, masses, or organomegaly.  Lymphatics: Unremarkable.  Musculoskeletal: Full ROM all peripheral extremities, joint stability, 5/5 strength, and normal gait.  Skin: Warm, dry without exposed rashes, lesions, ecchymosis apparent.  Neuro: Cranial nerves intact, reflexes equal bilaterally. Sensory-motor testing grossly intact. Tendon reflexes grossly intact.  Pysch: Alert & oriented x 3. Insight and judgement nl & appropriate. No ideations.  Assessment and Plan:  1. Hypertension - Continue monitor blood pressure at home. Continue diet/meds same.  2. Hyperlipidemia - Continue diet/meds, exercise,& lifestyle modifications. Continue monitor periodic cholesterol/liver & renal functions   3. Vitamin D Deficiency - Continue supplementation.  4. Feels that after his cipro that he is doing better and does not have the same  frequency as before. Will recheck urine.   Further disposition pending results of labs.

## 2013-03-30 NOTE — Patient Instructions (Addendum)
Continue diet & medications same as discussed.   Further disposition pending lab results.         Hypertension As your heart beats, it forces blood through your arteries. This force is your blood pressure. If the pressure is too high, it is called hypertension (HTN) or high blood pressure. HTN is dangerous because you may have it and not know it. High blood pressure may mean that your heart has to work harder to pump blood. Your arteries may be narrow or stiff. The extra work puts you at risk for heart disease, stroke, and other problems.  Blood pressure consists of two numbers, a higher number over a lower, 110/72, for example. It is stated as "110 over 72." The ideal is below 120 for the top number (systolic) and under 80 for the bottom (diastolic). Write down your blood pressure today. You should pay close attention to your blood pressure if you have certain conditions such as:  Heart failure.  Prior heart attack.  Diabetes  Chronic kidney disease.  Prior stroke.  Multiple risk factors for heart disease. To see if you have HTN, your blood pressure should be measured while you are seated with your arm held at the level of the heart. It should be measured at least twice. A one-time elevated blood pressure reading (especially in the Emergency Department) does not mean that you need treatment. There may be conditions in which the blood pressure is different between your right and left arms. It is important to see your caregiver soon for a recheck. Most people have essential hypertension which means that there is not a specific cause. This type of high blood pressure may be lowered by changing lifestyle factors such as:  Stress.  Smoking.  Lack of exercise.  Excessive weight.  Drug/tobacco/alcohol use.  Eating less salt. Most people do not have symptoms from high blood pressure until it has caused damage to the body. Effective treatment can often prevent, delay or reduce that  damage. TREATMENT  When a cause has been identified, treatment for high blood pressure is directed at the cause. There are a large number of medications to treat HTN. These fall into several categories, and your caregiver will help you select the medicines that are best for you. Medications may have side effects. You should review side effects with your caregiver. If your blood pressure stays high after you have made lifestyle changes or started on medicines,   Your medication(s) may need to be changed.  Other problems may need to be addressed.  Be certain you understand your prescriptions, and know how and when to take your medicine.  Be sure to follow up with your caregiver within the time frame advised (usually within two weeks) to have your blood pressure rechecked and to review your medications.  If you are taking more than one medicine to lower your blood pressure, make sure you know how and at what times they should be taken. Taking two medicines at the same time can result in blood pressure that is too low. SEEK IMMEDIATE MEDICAL CARE IF:  You develop a severe headache, blurred or changing vision, or confusion.  You have unusual weakness or numbness, or a faint feeling.  You have severe chest or abdominal pain, vomiting, or breathing problems. MAKE SURE YOU:   Understand these instructions.  Will watch your condition.  Will get help right away if you are not doing well or get worse. Document Released: 04/16/2005 Document Revised: 07/09/2011 Document Reviewed:  12/05/2007 [employer] Patient Information 2014 [place], [place].   Diabetes and Exercise Exercising regularly is important. It is not just about losing weight. It has many health benefits, such as:  Improving your overall fitness, flexibility, and endurance.  Increasing your bone density.  Helping with weight control.  Decreasing your body fat.  Increasing your muscle strength.  Reducing stress and  tension.  Improving your overall health. People with diabetes who exercise gain additional benefits because exercise:  Reduces appetite.  Improves the body's use of blood sugar (glucose).  Helps lower or control blood glucose.  Decreases blood pressure.  Helps control blood lipids (such as cholesterol and triglycerides).  Improves the body's use of the hormone insulin by:  Increasing the body's insulin sensitivity.  Reducing the body's insulin needs.  Decreases the risk for heart disease because exercising:  Lowers cholesterol and triglycerides levels.  Increases the levels of good cholesterol (such as high-density lipoproteins [HDL]) in the body.  Lowers blood glucose levels. YOUR ACTIVITY PLAN  Choose an activity that you enjoy and set realistic goals. Your health care provider or diabetes educator can help you make an activity plan that works for you. You can break activities into 2 or 3 sessions throughout the day. Doing so is as good as one long session. Exercise ideas include:  Taking the dog for a walk.  Taking the stairs instead of the elevator.  Dancing to your favorite song.  Doing your favorite exercise with a friend. RECOMMENDATIONS FOR EXERCISING WITH TYPE 1 OR TYPE 2 DIABETES   Check your blood glucose before exercising. If blood glucose levels are greater than 240 mg/dL, check for urine ketones. Do not exercise if ketones are present.  Avoid injecting insulin into areas of the body that are going to be exercised. For example, avoid injecting insulin into:  The arms when playing tennis.  The legs when jogging.  Keep a record of:  Food intake before and after you exercise.  Expected peak times of insulin action.  Blood glucose levels before and after you exercise.  The type and amount of exercise you have done.  Review your records with your health care provider. Your health care provider will help you to develop guidelines for adjusting food  intake and insulin amounts before and after exercising.  If you take insulin or oral hypoglycemic agents, watch for signs and symptoms of hypoglycemia. They include:  Dizziness.  Shaking.  Sweating.  Chills.  Confusion.  Drink plenty of water while you exercise to prevent dehydration or heat stroke. Body water is lost during exercise and must be replaced.  Talk to your health care provider before starting an exercise program to make sure it is safe for you. Remember, almost any type of activity is better than none. Document Released: 07/07/2003 Document Revised: 12/17/2012 Document Reviewed: 09/23/2012 [facility] Patient Information 2014 [place], [place]. Cholesterol Cholesterol is a white, waxy, fat-like protein needed by your body in small amounts. The liver makes all the cholesterol you need. It is carried from the liver by the blood through the blood vessels. Deposits (plaque) may build up on blood vessel walls. This makes the arteries narrower and stiffer. Plaque increases the risk for heart attack and stroke. You cannot feel your cholesterol level even if it is very high. The only way to know is by a blood test to check your lipid (fats) levels. Once you know your cholesterol levels, you should keep a record of the test results. Work with your caregiver to  to keep your levels in the desired range. WHAT THE RESULTS MEAN:  Total cholesterol is a rough measure of all the cholesterol in your blood.  LDL is the so-called bad cholesterol. This is the type that deposits cholesterol in the walls of the arteries. You want this level to be low.  HDL is the good cholesterol because it cleans the arteries and carries the LDL away. You want this level to be high.  Triglycerides are fat that the body can either burn for energy or store. High levels are closely linked to heart disease. DESIRED LEVELS:  Total cholesterol below 200.  LDL below 100 for people at risk, below 70 for very high  risk.  HDL above 50 is good, above 60 is best.  Triglycerides below 150. HOW TO LOWER YOUR CHOLESTEROL:  Diet.  Choose fish or white meat chicken and [place], roasted or baked. Limit fatty cuts of red meat, fried foods, and processed meats, such as sausage and lunch meat.  Eat lots of fresh fruits and vegetables. Choose whole grains, beans, pasta, potatoes and cereals.  Use only small amounts of olive, corn or canola oils. Avoid butter, mayonnaise, shortening or palm kernel oils. Avoid foods with trans-fats.  Use skim/nonfat milk and low-fat/nonfat yogurt and cheeses. Avoid whole milk, cream, ice cream, egg yolks and cheeses. Healthy desserts include angel food cake, ginger snaps, animal crackers, hard candy, popsicles, and low-fat/nonfat frozen yogurt. Avoid pastries, cakes, pies and cookies.  Exercise.  A regular program helps decrease LDL and raises HDL.  Helps with weight control.  Do things that increase your activity level like gardening, walking, or taking the stairs.  Medication.  May be prescribed by your caregiver to help lowering cholesterol and the risk for heart disease.  You may need medicine even if your levels are normal if you have several risk factors. HOME CARE INSTRUCTIONS   Follow your diet and exercise programs as suggested by your caregiver.  Take medications as directed.  Have blood work done when your caregiver feels it is necessary. MAKE SURE YOU:   Understand these instructions.  Will watch your condition.  Will get help right away if you are not doing well or get worse. Document Released: 01/09/2001 Document Revised: 07/09/2011 Document Reviewed: 07/02/2007 [facility] Patient Information 2014 [place], [place].   Vitamin D Deficiency Vitamin D is an important vitamin that your body needs. Having too little of it in your body is called a deficiency. A very bad deficiency can make your bones soft and can cause a condition called rickets.  Vitamin  D is important to your body for different reasons, such as:   It helps your body absorb 2 minerals called calcium and phosphorus.  It helps make your bones healthy.  It may prevent some diseases, such as diabetes and multiple sclerosis.  It helps your muscles and heart. You can get vitamin D in several ways. It is a natural part of some foods. The vitamin is also added to some dairy products and cereals. Some people take vitamin D supplements. Also, your body makes vitamin D when you are in the sun. It changes the sun's rays into a form of the vitamin that your body can use. CAUSES   Not eating enough foods that contain vitamin D.  Not getting enough sunlight.  Having certain digestive system diseases that make it hard to absorb vitamin D. These diseases include Crohn's disease, chronic pancreatitis, and cystic fibrosis.  Having a surgery in which part of  the stomach or small intestine is removed.  Being obese. Fat cells pull vitamin D out of your blood. That means that obese people may not have enough vitamin D left in their blood and in other body tissues.  Having chronic kidney or liver disease. RISK FACTORS Risk factors are things that make you more likely to develop a vitamin D deficiency. They include:  Being older.  Not being able to get outside very much.  Living in a nursing home.  Having had broken bones.  Having weak or thin bones (osteoporosis).  Having a disease or condition that changes how your body absorbs vitamin D.  Having dark skin.  Some medicines such as seizure medicines or steroids.  Being overweight or obese. SYMPTOMS Mild cases of vitamin D deficiency may not have any symptoms. If you have a very bad case, symptoms may include:  Bone pain.  Muscle pain.  Falling often.  Broken bones caused by a minor injury, due to osteoporosis. DIAGNOSIS A blood test is the best way to tell if you have a vitamin D deficiency. TREATMENT Vitamin D  deficiency can be treated in different ways. Treatment for vitamin D deficiency depends on what is causing it. Options include:  Taking vitamin D supplements.  Taking a calcium supplement. Your caregiver will suggest what dose is best for you. HOME CARE INSTRUCTIONS  Take any supplements that your caregiver prescribes. Follow the directions carefully. Take only the suggested amount.  Have your blood tested 2 months after you start taking supplements.  Eat foods that contain vitamin D. Healthy choices include:  Fortified dairy products, cereals, or juices. Fortified means vitamin D has been added to the food. Check the label on the package to be sure.  Fatty fish like salmon or trout.  Eggs.  Oysters.  Do not use a tanning bed.  Keep your weight at a healthy level. Lose weight if you need to.  Keep all follow-up appointments. Your caregiver will need to perform blood tests to make sure your vitamin D deficiency is going away. SEEK MEDICAL CARE IF:  You have any questions about your treatment.  You continue to have symptoms of vitamin D deficiency.  You have nausea or vomiting.  You are constipated.  You feel confused.  You have severe abdominal or back pain. MAKE SURE YOU:  Understand these instructions.  Will watch your condition.  Will get help right away if you are not doing well or get worse. Document Released: 07/09/2011 Document Revised: 08/11/2012 Document Reviewed: 07/09/2011 [facility] [place] [facility] Patient Information 2014 [place], [place].   Osteoarthritis Osteoarthritis is the most common form of arthritis. It is redness, soreness, and swelling (inflammation) affecting the cartilage. Cartilage acts as a cushion, covering the ends of bones where they meet to form a joint. CAUSES  Over time, the cartilage begins to wear away. This causes bone to rub on bone. This produces pain and stiffness in the affected joints. Factors that contribute to this problem are:  Excessive  body weight.  Age.  Overuse of joints. SYMPTOMS   People with osteoarthritis usually experience joint pain, swelling, or stiffness.  Over time, the joint may lose its normal shape.  Small deposits of bone (osteophytes) may grow on the edges of the joint.  Bits of bone or cartilage can break off and float inside the joint space. This may cause more pain and damage.  Osteoarthritis can lead to depression, anxiety, feelings of helplessness, and limitations on daily activities. The most commonly affected joints  are in the:  Ends of the fingers.  Thumbs.  Neck.  Lower back.  Knees.  Hips. DIAGNOSIS  Diagnosis is mostly based on your symptoms and exam. Tests may be helpful, including:  X-rays of the affected joint.  A computerized magnetic scan (MRI).  Blood tests to rule out other types of arthritis.  Joint fluid tests. This involves using a needle to draw fluid from the joint and examining the fluid under a microscope. TREATMENT  Goals of treatment are to control pain, improve joint function, maintain a normal body weight, and maintain a healthy lifestyle. Treatment approaches may include:  A prescribed exercise program with rest and joint relief.  Weight control with nutritional education.  Pain relief techniques such as:  Properly applied heat and cold.  Electric pulses delivered to nerve endings under the skin (transcutaneous electrical nerve stimulation, TENS).  Massage.  Certain supplements. Ask your caregiver before using any supplements, especially in combination with prescribed drugs.  Medicines to control pain, such as:  Acetaminophen.  Nonsteroidal anti-inflammatory drugs (NSAIDs), such as naproxen.  Narcotic or central-acting agents, such as tramadol. This drug carries a risk of addiction and is generally prescribed for short-term use.  Corticosteroids. These can be given orally or as injection. This is a short-term treatment, not recommended for  routine use.  Surgery to reposition the bones and relieve pain (osteotomy) or to remove loose pieces of bone and cartilage. Joint replacement may be needed in advanced states of osteoarthritis. HOME CARE INSTRUCTIONS  Your caregiver can recommend specific types of exercise. These may include:  Strengthening exercises. These are done to strengthen the muscles that support joints affected by arthritis. They can be performed with weights or with exercise bands to add resistance.  Aerobic activities. These are exercises, such as brisk walking or low-impact aerobics, that get your heart pumping. They can help keep your lungs and circulatory system in shape.  Range-of-motion activities. These keep your joints limber.  Balance and agility exercises. These help you maintain daily living skills. Learning about your condition and being actively involved in your care will help improve the course of your osteoarthritis. SEEK MEDICAL CARE IF:   You feel hot or your skin turns red.  You develop a rash in addition to your joint pain.  You have an oral temperature above 102 F (38.9 C). FOR MORE INFORMATION  National Institute of Arthritis and Musculoskeletal and Skin Diseases: www.niams.http://www.myers.net/ [employer] on Aging: https://walker.com/ American College of Rheumatology: www.rheumatology.org Document Released: 04/16/2005 Document Revised: 07/09/2011 Document Reviewed: 07/28/2009 [facility] Patient Information 2014 [place], [place].

## 2013-03-31 ENCOUNTER — Telehealth: Payer: Self-pay | Admitting: Physician Assistant

## 2013-03-31 LAB — URINALYSIS, MICROSCOPIC ONLY
Bacteria, UA: NONE SEEN
Crystals: NONE SEEN
Squamous Epithelial / HPF: NONE SEEN

## 2013-03-31 LAB — URINALYSIS, ROUTINE W REFLEX MICROSCOPIC
Bilirubin Urine: NEGATIVE
Glucose, UA: NEGATIVE mg/dL
Hgb urine dipstick: NEGATIVE
Ketones, ur: NEGATIVE mg/dL
Nitrite: NEGATIVE
Protein, ur: NEGATIVE mg/dL
Specific Gravity, Urine: 1.015 (ref 1.005–1.030)
Urobilinogen, UA: 0.2 mg/dL (ref 0.0–1.0)
pH: 5.5 (ref 5.0–8.0)

## 2013-03-31 LAB — URINE CULTURE
Colony Count: NO GROWTH
Organism ID, Bacteria: NO GROWTH

## 2013-03-31 MED ORDER — NIFEDIPINE ER 30 MG PO TB24: 30.0000 mg | ORAL_TABLET | Freq: Every day | ORAL | Status: DC

## 2013-03-31 NOTE — Telephone Encounter (Signed)
Pt called wants a refill on nifedipn (sp) 30 mg= he was waiting on a mail order=but never came [facility] [address] Please do this soon=he wants to pick up this afternoon Can call pt back at [phone]

## 2013-03-31 NOTE — Telephone Encounter (Signed)
Patient aware RX called in

## 2013-04-16 DIAGNOSIS — H353290 Exudative age-related macular degeneration, unspecified eye, stage unspecified: Secondary | ICD-10-CM | POA: Diagnosis not present

## 2013-04-28 ENCOUNTER — Ambulatory Visit (INDEPENDENT_AMBULATORY_CARE_PROVIDER_SITE_OTHER): Payer: Medicare Other | Admitting: *Deleted

## 2013-04-28 DIAGNOSIS — N3000 Acute cystitis without hematuria: Secondary | ICD-10-CM | POA: Diagnosis not present

## 2013-04-28 NOTE — Progress Notes (Signed)
Patient ID: Martin Massey, male   DOB: [birth year], 79 y.o.   MRN: [id number] Patient came to office with no appointment and wanted to speak with Dr Oneta Rack.  Patient states he was using a public restroom in [place] and the toilet water and contents sprayed up on his buttock and pt is concerned he may have been exposed to a disease. Per Dr Oneta Rack, not likely he could get a disease from this incident and this was relayed to pt by Marlana Latus, office manager.  Patient accepted that he  could  not talk to Dr Oneta Rack but insisted he have his urine checked for infection.. Urinalysis and culture ordered (dx 595.0) per Dr Oneta Rack.

## 2013-04-29 LAB — URINALYSIS, ROUTINE W REFLEX MICROSCOPIC
Bilirubin Urine: NEGATIVE
Glucose, UA: NEGATIVE mg/dL
Hgb urine dipstick: NEGATIVE
Ketones, ur: NEGATIVE mg/dL
Nitrite: NEGATIVE
Protein, ur: NEGATIVE mg/dL
Specific Gravity, Urine: 1.01 (ref 1.005–1.030)
Urobilinogen, UA: 0.2 mg/dL (ref 0.0–1.0)
pH: 6 (ref 5.0–8.0)

## 2013-04-29 LAB — URINALYSIS, MICROSCOPIC ONLY
Casts: NONE SEEN
Crystals: NONE SEEN

## 2013-04-30 LAB — URINE CULTURE
Colony Count: NO GROWTH
Organism ID, Bacteria: NO GROWTH

## 2013-05-06 ENCOUNTER — Encounter: Payer: Self-pay | Admitting: Physician Assistant

## 2013-05-06 ENCOUNTER — Ambulatory Visit (INDEPENDENT_AMBULATORY_CARE_PROVIDER_SITE_OTHER): Payer: Medicare Other | Admitting: Physician Assistant

## 2013-05-06 VITALS — BP 140/72 | HR 56 | Temp 98.1°F | Resp 16 | Ht 69.0 in | Wt 177.0 lb

## 2013-05-06 DIAGNOSIS — Z1159 Encounter for screening for other viral diseases: Secondary | ICD-10-CM

## 2013-05-06 DIAGNOSIS — R300 Dysuria: Secondary | ICD-10-CM

## 2013-05-06 DIAGNOSIS — N3000 Acute cystitis without hematuria: Secondary | ICD-10-CM | POA: Diagnosis not present

## 2013-05-06 LAB — BASIC METABOLIC PANEL WITH GFR
BUN: 25 mg/dL — ABNORMAL HIGH (ref 6–23)
CO2: 28 mEq/L (ref 19–32)
Calcium: 9.8 mg/dL (ref 8.4–10.5)
Chloride: 101 mEq/L (ref 96–112)
Creat: 1.04 mg/dL (ref 0.50–1.35)
GFR, Est African American: 73 mL/min
GFR, Est Non African American: 63 mL/min
Glucose, Bld: 92 mg/dL (ref 70–99)
Potassium: 3.8 mEq/L (ref 3.5–5.3)
Sodium: 137 mEq/L (ref 135–145)

## 2013-05-06 LAB — CBC WITH DIFFERENTIAL/PLATELET
Basophils Absolute: 0 10*3/uL (ref 0.0–0.1)
Basophils Relative: 1 % (ref 0–1)
Eosinophils Absolute: 0.2 10*3/uL (ref 0.0–0.7)
Eosinophils Relative: 3 % (ref 0–5)
HCT: 42.7 % (ref 39.0–52.0)
Hemoglobin: 14.8 g/dL (ref 13.0–17.0)
Lymphocytes Relative: 33 % (ref 12–46)
Lymphs Abs: 2 10*3/uL (ref 0.7–4.0)
MCH: 31.4 pg (ref 26.0–34.0)
MCHC: 34.7 g/dL (ref 30.0–36.0)
MCV: 90.7 fL (ref 78.0–100.0)
Monocytes Absolute: 0.6 10*3/uL (ref 0.1–1.0)
Monocytes Relative: 9 % (ref 3–12)
Neutro Abs: 3.5 10*3/uL (ref 1.7–7.7)
Neutrophils Relative %: 54 % (ref 43–77)
Platelets: 200 10*3/uL (ref 150–400)
RBC: 4.71 MIL/uL (ref 4.22–5.81)
RDW: 13.4 % (ref 11.5–15.5)
WBC: 6.3 10*3/uL (ref 4.0–10.5)

## 2013-05-06 NOTE — Progress Notes (Signed)
   Subjective:    Patient ID: Martin Massey, male    DOB: May 06, 1920, 80 y.o.   MRN: [id number]  HPI Patient states has been going to the bathroom more often, nocturia, and recently feels that his testicles have been swollen. Some hesitancy and urgency but  discharge.   He has had two incidents of having been exposed to fecal matter to public restrooms and having been in [place] with a lot of veneral disease he is rather concerned about this possibility. I have explained the unlikely nature of exposure this way.    Review of Systems  Constitutional: Negative.   HENT: Negative.   Respiratory: Negative.   Cardiovascular: Negative.   Gastrointestinal: Negative.   Genitourinary: Positive for urgency, frequency, scrotal swelling and difficulty urinating. Negative for hematuria, discharge, penile pain and testicular pain.  Musculoskeletal: Negative.   Skin: Negative.   Psychiatric/Behavioral: Negative.        Objective:   Physical Exam  Vitals reviewed. Constitutional: He is oriented to person, place, and time. He appears well-developed. No distress.  Cardiovascular: Normal rate and regular rhythm.   Pulmonary/Chest: Effort normal and breath sounds normal.  Abdominal: Soft. Bowel sounds are normal.  Genitourinary: Testes normal and penis normal.  Neurological: He is alert and oriented to person, place, and time.  Skin: Skin is warm and dry.  Psychiatric: He has a normal mood and affect. His behavior is normal.       Assessment & Plan:  Acute cystitis - Plan: CBC with Differential, BASIC METABOLIC PANEL WITH GFR, Urinalysis, Routine w reflex microscopic, Urine culture, RPR  Screening for viral disease - Plan: RPR,  GC/chlamydia probe amp, urine

## 2013-05-07 LAB — URINALYSIS, ROUTINE W REFLEX MICROSCOPIC
Bilirubin Urine: NEGATIVE
Glucose, UA: NEGATIVE mg/dL
Hgb urine dipstick: NEGATIVE
Ketones, ur: NEGATIVE mg/dL
Nitrite: NEGATIVE
Protein, ur: NEGATIVE mg/dL
Specific Gravity, Urine: 1.008 (ref 1.005–1.030)
Urobilinogen, UA: 0.2 mg/dL (ref 0.0–1.0)
pH: 6 (ref 5.0–8.0)

## 2013-05-07 LAB — URINE CULTURE
Colony Count: NO GROWTH
Organism ID, Bacteria: NO GROWTH

## 2013-05-07 LAB — GC/CHLAMYDIA PROBE AMP, URINE
Chlamydia, Swab/Urine, PCR: NEGATIVE
GC Probe Amp, Urine: NEGATIVE

## 2013-05-07 LAB — URINALYSIS, MICROSCOPIC ONLY
Bacteria, UA: NONE SEEN
Casts: NONE SEEN
Crystals: NONE SEEN
SQUAMOUS EPITHELIAL / LPF: NONE SEEN

## 2013-05-07 LAB — RPR

## 2013-05-28 DIAGNOSIS — H409 Unspecified glaucoma: Secondary | ICD-10-CM | POA: Diagnosis not present

## 2013-05-28 DIAGNOSIS — H353290 Exudative age-related macular degeneration, unspecified eye, stage unspecified: Secondary | ICD-10-CM | POA: Diagnosis not present

## 2013-05-28 DIAGNOSIS — H4010X0 Unspecified open-angle glaucoma, stage unspecified: Secondary | ICD-10-CM | POA: Diagnosis not present

## 2013-05-28 DIAGNOSIS — Z961 Presence of intraocular lens: Secondary | ICD-10-CM | POA: Diagnosis not present

## 2013-06-04 DIAGNOSIS — H4010X0 Unspecified open-angle glaucoma, stage unspecified: Secondary | ICD-10-CM | POA: Diagnosis not present

## 2013-06-04 DIAGNOSIS — H409 Unspecified glaucoma: Secondary | ICD-10-CM | POA: Diagnosis not present

## 2013-06-04 DIAGNOSIS — H353290 Exudative age-related macular degeneration, unspecified eye, stage unspecified: Secondary | ICD-10-CM | POA: Diagnosis not present

## 2013-06-04 DIAGNOSIS — Z961 Presence of intraocular lens: Secondary | ICD-10-CM | POA: Diagnosis not present

## 2013-06-07 ENCOUNTER — Other Ambulatory Visit: Payer: Self-pay | Admitting: Emergency Medicine

## 2013-06-09 ENCOUNTER — Ambulatory Visit: Payer: Self-pay | Admitting: Internal Medicine

## 2013-06-09 ENCOUNTER — Other Ambulatory Visit: Payer: Self-pay | Admitting: Internal Medicine

## 2013-06-10 ENCOUNTER — Encounter: Payer: Self-pay | Admitting: Internal Medicine

## 2013-06-10 ENCOUNTER — Ambulatory Visit (INDEPENDENT_AMBULATORY_CARE_PROVIDER_SITE_OTHER): Payer: Medicare Other | Admitting: Internal Medicine

## 2013-06-10 ENCOUNTER — Other Ambulatory Visit: Payer: Self-pay | Admitting: Emergency Medicine

## 2013-06-10 VITALS — BP 132/74 | HR 76 | Temp 99.3°F | Resp 16 | Wt 168.6 lb

## 2013-06-10 DIAGNOSIS — N419 Inflammatory disease of prostate, unspecified: Secondary | ICD-10-CM | POA: Diagnosis not present

## 2013-06-10 MED ORDER — CIPROFLOXACIN HCL 500 MG PO TABS: 500.0000 mg | ORAL_TABLET | Freq: Two times a day (BID) | ORAL | Status: AC

## 2013-06-10 NOTE — Progress Notes (Signed)
Subjective:    Patient ID: Martin Massey, male    DOB: Jan 03, 1921, 80 y.o.   MRN: [id number]  Urinary Frequency  This is a new problem. The current episode started 1 to 4 weeks ago. The problem occurs intermittently. The problem has been gradually worsening. The quality of the pain is described as aching. The pain is mild. The maximum temperature recorded prior to his arrival was 100 - 100.9 F. The fever has been present for 3 - 4 days. He is not sexually active. There is no history of pyelonephritis. Associated symptoms include frequency and hesitancy. Pertinent negatives include no chills, discharge, flank pain, hematuria, nausea, sweats, urgency or vomiting. He has tried nothing for the symptoms. The treatment provided no relief.     Medication List       This list is accurate as of: 06/10/13  7:44 PM.  Always use your most recent med list.               aspirin 81 MG tablet  Take 81 mg by mouth daily.     atorvastatin 20 MG tablet  Commonly known as:  LIPITOR  Take 20 mg by mouth daily.     ciprofloxacin 500 MG tablet  Commonly known as:  CIPRO  Take 1 tablet (500 mg total) by mouth 2 (two) times daily. For prostate infection     dorzolamide 2 % ophthalmic solution  Commonly known as:  TRUSOPT     enalapril 20 MG tablet  Commonly known as:  VASOTEC  TAKE 1 TABLET DAILY FOR    BLOOD PRESSURE     finasteride 5 MG tablet  Commonly known as:  PROSCAR  Take 5 mg by mouth daily.     hydrochlorothiazide 25 MG tablet  Commonly known as:  HYDRODIURIL  Take 25 mg by mouth daily.     latanoprost 0.005 % ophthalmic solution  Commonly known as:  XALATAN     levothyroxine 50 MCG tablet  Commonly known as:  SYNTHROID, LEVOTHROID  50 mcg. 1 trab on mon, wed, and fri and 1 1/2 on tu, th, sat and sun     multivitamin with minerals Tabs tablet  Take 1 tablet by mouth daily.     NIFEdipine 30 MG 24 hr tablet  Commonly known as:  PROCARDIA-XL/ADALAT CC  Take 1 tablet (30 mg  total) by mouth daily.     ranitidine 150 MG capsule  Commonly known as:  ZANTAC  Take 150 mg by mouth as needed for heartburn.     Vitamin D 2000 UNITS Caps  Take by mouth. 2 in the morning and 1 at night       Allergies  Allergen Reactions  . Capoten [Captopril] Cough  . Lopressor [Metoprolol] Other (See Comments)    bradycardia   Past Medical History  Diagnosis Date  . CAD (coronary artery disease)     CABG x3  . Hypertension   . Hyperlipidemia   . History of stress test 11/30/2011    was nonischemic and unchanged from prior study  . MI (myocardial infarction) 07/1989    Non-Q wave   . H/O angioplasty 07/1989    with PTCA of 2 vessels  . Colon cancer 02/19/01  . Kidney stones   . Hypothyroidism   . GERD (gastroesophageal reflux disease)   . History of echocardiogram 12/06    EF 50%-55% which was normal with mild to moderate MR and a cardiolite which showed inferior scar versus diaphragmatic  attenuation   . History of stress test 06.2011    showed diastolic continuation without ischemia.     Review of Systems  Constitutional: Positive for fever and fatigue. Negative for chills, diaphoresis, activity change and appetite change.  HENT: Negative.   Eyes: Negative.   Respiratory: Negative.   Cardiovascular: Negative.   Gastrointestinal: Negative for nausea and vomiting.  Endocrine: Negative.   Genitourinary: Positive for hesitancy, frequency and difficulty urinating. Negative for dysuria, urgency, hematuria, flank pain, discharge, penile swelling, scrotal swelling, enuresis, penile pain and testicular pain.  Musculoskeletal: Negative.   Neurological: Negative.   Hematological: Negative.        Objective:   Physical Exam  Constitutional: He appears well-developed and well-nourished.  HENT:  Head: Normocephalic.  Eyes: Conjunctivae and EOM are normal. Pupils are equal, round, and reactive to light. Right eye exhibits no discharge. Left eye exhibits no discharge.  No scleral icterus.  Neck: Normal range of motion. Neck supple. No JVD present. No thyromegaly present.  Cardiovascular: Normal rate and regular rhythm.  Exam reveals no gallop.   No murmur heard. Pulmonary/Chest: Effort normal and breath sounds normal. No respiratory distress. He has no wheezes. He has no rales. He exhibits no tenderness.  Abdominal: Soft. Bowel sounds are normal. He exhibits no distension and no mass. There is no guarding.  Genitourinary: Rectum normal and penis normal. Guaiac negative stool. No penile tenderness.  Prostate is 2-3 (+) enlarged soft & boggy   Lymphadenopathy:    He has no cervical adenopathy.    Assessment & Plan:  1. Prostatitis  - ciprofloxacin (CIPRO) 500 MG tablet; Take 1 tablet (500 mg total) by mouth 2 (two) times daily. For prostate infection  Dispense: 60 tablet; Refill: 0 - Urine Microscopic - Urine culture

## 2013-06-10 NOTE — Patient Instructions (Signed)
Prostatitis The prostate gland is about the size and shape of a walnut. It is located just below your bladder. It produces one of the components of semen, which is made up of sperm and the fluids that help nourish and transport it out from the testicles. Prostatitis is inflammation of the prostate gland.  There are four types of prostatitis:  Acute bacterial prostatitis This is the least common type of prostatitis. It starts quickly and usually is associated with a bladder infection, high fever, and shaking chills. It can occur at any age.  Chronic bacterial prostatitis This is a persistent bacterial infection in the prostate. It usually develops from repeated acute bacterial prostatitis or acute bacterial prostatitis that was not properly treated. It can occur in men of any age but is most common in middle-aged men whose prostate has begun to enlarge. The symptoms are not as severe as those in acute bacterial prostatitis. Discomfort in the part of your body that is in front of your rectum and below your scrotum (perineum), lower abdomen, or in the head of your penis (glans) may represent your primary discomfort.  Chronic prostatitis (nonbacterial) This is the most common type of prostatitis. It is inflammation of the prostate gland that is not caused by a bacterial infection. The cause is unknown and may be associated with a viral infection or autoimmune disorder.  Prostatodynia (pelvic floor disorder) This is associated with increased muscular tone in the pelvis surrounding the prostate. CAUSES The causes of bacterial prostatitis are bacterial infection. The causes of the other types of prostatitis are unknown.  SYMPTOMS  Symptoms can vary depending upon the type of prostatitis that exists. There can also be overlap in symptoms. Possible symptoms for each type of prostatitis are listed below. Acute Bacterial Prostatitis  Painful urination.  Fever or chills.  Muscle or joint pains.  Low  back pain.  Low abdominal pain.  Inability to empty bladder completely. Chronic Bacterial Prostatitis, Chronic Nonbacterial Prostatitis, and Prostatodynia  Sudden urge to urinate.  Frequent urination.  Difficulty starting urine stream.  Weak urine stream.  Discharge from the urethra.  Dribbling after urination.  Rectal pain.  Pain in the testicles, penis, or tip of the penis.  Pain in the perineum.  Problems with sexual function.  Painful ejaculation.  Bloody semen. DIAGNOSIS  In order to diagnose prostatitis, your health care provider will ask about your symptoms. One or more urine samples will be taken and tested (urinalysis). If the urinalysis result is negative for bacteria, your health care provider may use a finger to feel your prostate (digital rectal exam). This exam helps your health care provider determine if your prostate is swollen and tender. It will also produce a specimen of semen that can be analyzed. TREATMENT  Treatment for prostatitis depends on the cause. If a bacterial infection is the cause, it can be treated with antibiotic medicine. In cases of chronic bacterial prostatitis, the use of antibiotics for up to 1 month or 6 weeks may be necessary. Your health care provider may instruct you to take sitz baths to help relieve pain. A sitz bath is a bath of hot water in which your hips and buttocks are under water. This relaxes the pelvic floor muscles and often helps to relieve the pressure on your prostate. HOME CARE INSTRUCTIONS   Take all medicines as directed by your health care provider.  Take sitz baths as directed by your health care provider. SEEK MEDICAL CARE IF:   Your symptoms   get worse, not better.  You have a fever. SEEK IMMEDIATE MEDICAL CARE IF:   You have chills.  You feel nauseous or vomit.  You feel lightheaded or faint.  You are unable to urinate.  You have blood or blood clots in your urine. Document Released: 04/13/2000  Document Revised: 02/04/2013 Document Reviewed: 11/03/2012 [employer] Patient Information 2014 [employer].  

## 2013-06-11 LAB — URINALYSIS, MICROSCOPIC ONLY
Bacteria, UA: NONE SEEN
CASTS: NONE SEEN
Crystals: NONE SEEN
SQUAMOUS EPITHELIAL / LPF: NONE SEEN

## 2013-06-11 LAB — URINE CULTURE
Colony Count: NO GROWTH
Organism ID, Bacteria: NO GROWTH

## 2013-06-21 ENCOUNTER — Other Ambulatory Visit: Payer: Self-pay | Admitting: Emergency Medicine

## 2013-07-03 ENCOUNTER — Ambulatory Visit: Payer: Self-pay | Admitting: Emergency Medicine

## 2013-07-03 ENCOUNTER — Ambulatory Visit (INDEPENDENT_AMBULATORY_CARE_PROVIDER_SITE_OTHER): Payer: Medicare Other | Admitting: Emergency Medicine

## 2013-07-03 ENCOUNTER — Encounter: Payer: Self-pay | Admitting: Emergency Medicine

## 2013-07-03 VITALS — BP 116/60 | HR 56 | Temp 98.2°F | Resp 16 | Ht 69.0 in | Wt 172.0 lb

## 2013-07-03 DIAGNOSIS — N419 Inflammatory disease of prostate, unspecified: Secondary | ICD-10-CM | POA: Diagnosis not present

## 2013-07-03 DIAGNOSIS — E782 Mixed hyperlipidemia: Secondary | ICD-10-CM

## 2013-07-03 DIAGNOSIS — E559 Vitamin D deficiency, unspecified: Secondary | ICD-10-CM | POA: Diagnosis not present

## 2013-07-03 DIAGNOSIS — I10 Essential (primary) hypertension: Secondary | ICD-10-CM

## 2013-07-03 LAB — HEPATIC FUNCTION PANEL
ALK PHOS: 68 U/L (ref 39–117)
ALT: 19 U/L (ref 0–53)
AST: 28 U/L (ref 0–37)
Albumin: 3.6 g/dL (ref 3.5–5.2)
BILIRUBIN INDIRECT: 0.6 mg/dL (ref 0.2–1.2)
Bilirubin, Direct: 0.2 mg/dL (ref 0.0–0.3)
TOTAL PROTEIN: 5.6 g/dL — AB (ref 6.0–8.3)
Total Bilirubin: 0.8 mg/dL (ref 0.2–1.2)

## 2013-07-03 LAB — CBC WITH DIFFERENTIAL/PLATELET
Basophils Absolute: 0.1 10*3/uL (ref 0.0–0.1)
Basophils Relative: 1 % (ref 0–1)
Eosinophils Absolute: 0.2 10*3/uL (ref 0.0–0.7)
Eosinophils Relative: 3 % (ref 0–5)
HCT: 34.6 % — ABNORMAL LOW (ref 39.0–52.0)
HEMOGLOBIN: 12.2 g/dL — AB (ref 13.0–17.0)
LYMPHS PCT: 27 % (ref 12–46)
Lymphs Abs: 1.4 10*3/uL (ref 0.7–4.0)
MCH: 31.4 pg (ref 26.0–34.0)
MCHC: 35.3 g/dL (ref 30.0–36.0)
MCV: 89.2 fL (ref 78.0–100.0)
Monocytes Absolute: 0.6 10*3/uL (ref 0.1–1.0)
Monocytes Relative: 11 % (ref 3–12)
NEUTROS ABS: 3 10*3/uL (ref 1.7–7.7)
NEUTROS PCT: 58 % (ref 43–77)
PLATELETS: 200 10*3/uL (ref 150–400)
RBC: 3.88 MIL/uL — AB (ref 4.22–5.81)
RDW: 13.6 % (ref 11.5–15.5)
WBC: 5.1 10*3/uL (ref 4.0–10.5)

## 2013-07-03 LAB — LIPID PANEL
CHOL/HDL RATIO: 2.4 ratio
Cholesterol: 118 mg/dL (ref 0–200)
HDL: 49 mg/dL (ref >39)
LDL CALC: 58 mg/dL (ref 0–99)
Triglycerides: 56 mg/dL (ref <150)
VLDL: 11 mg/dL (ref 0–40)

## 2013-07-03 LAB — BASIC METABOLIC PANEL WITH GFR
BUN: 21 mg/dL (ref 6–23)
CO2: 28 meq/L (ref 19–32)
Calcium: 9.4 mg/dL (ref 8.4–10.5)
Chloride: 102 mEq/L (ref 96–112)
Creat: 1.01 mg/dL (ref 0.50–1.35)
GFR, EST AFRICAN AMERICAN: 75 mL/min
GFR, Est Non African American: 65 mL/min
GLUCOSE: 83 mg/dL (ref 70–99)
POTASSIUM: 3.8 meq/L (ref 3.5–5.3)
Sodium: 135 mEq/L (ref 135–145)

## 2013-07-03 LAB — MAGNESIUM: MAGNESIUM: 1.9 mg/dL (ref 1.5–2.5)

## 2013-07-03 NOTE — Progress Notes (Signed)
Subjective:    Patient ID: Martin Massey, male    DOB: January 21, 1921, 80 y.o.   MRN: [id number]  HPI Comments: 80 yo male presents for 3 month F/U for HTN, Cholesterol, D. Deficient. He is doing well overall. Wife concerned he is not eating enough. She is trying to improve his diet. He is not exercising as much with weather but keeps active. He notes  BP good at home. LAST LABS CHOL         136   03/30/2013 HDL           52   03/30/2013 LDLCALC       67   03/30/2013 TRIG          85   03/30/2013 CHOLHDL      2.6   03/30/2013 ALT           18   03/30/2013 AST           23   03/30/2013 ALKPHOS       83   03/30/2013 BILITOT      0.6   03/30/2013 CREATININE     1.04   05/06/2013 BUN              25   05/06/2013 NA              137   05/06/2013 K               3.8   05/06/2013 CL              101   05/06/2013 CO2              28   05/06/2013 D 82 He has 1 week left on antibiotics for prostate infection. He notes he still has nocturia every 3 hours but it has improved during the day. His wife notes it has improved at night also. He denies any difficulty with urination otherwise.  Hyperlipidemia  Hypertension     Current Outpatient Prescriptions on File Prior to Visit  Medication Sig Dispense Refill  . aspirin 81 MG tablet Take 81 mg by mouth daily.      Marland Kitchen atorvastatin (LIPITOR) 20 MG tablet Take 20 mg by mouth daily.      . Cholecalciferol (VITAMIN D) 2000 UNITS CAPS Take by mouth. 2 in the morning and 1 at night      . dorzolamide (TRUSOPT) 2 % ophthalmic solution       . enalapril (VASOTEC) 20 MG tablet TAKE 1 TABLET DAILY FOR    BLOOD PRESSURE  90 tablet  0  . finasteride (PROSCAR) 5 MG tablet TAKE 1 TABLET DAILY  90 tablet  2  . hydrochlorothiazide (HYDRODIURIL) 25 MG tablet Take 25 mg by mouth daily.      Marland Kitchen latanoprost (XALATAN) 0.005 % ophthalmic solution       . levothyroxine (SYNTHROID, LEVOTHROID) 50 MCG tablet 50 mcg. 1 trab on mon, wed, and fri and 1 1/2 on tu, th, sat and sun      .  Multiple Vitamin (MULTIVITAMIN WITH MINERALS) TABS tablet Take 1 tablet by mouth daily.      Marland Kitchen NIFEdipine (PROCARDIA-XL/ADALAT CC) 30 MG 24 hr tablet Take 1 tablet (30 mg total) by mouth daily.  30 tablet  2  . ranitidine (ZANTAC) 150 MG capsule Take 150 mg by mouth as needed for heartburn.       No current facility-administered medications on file prior to visit.  Allergies  Allergen Reactions  . Capoten [Captopril] Cough  . Lopressor [Metoprolol] Other (See Comments)    bradycardia   Past Medical History  Diagnosis Date  . CAD (coronary artery disease)     CABG x3  . Hypertension   . Hyperlipidemia   . History of stress test 11/30/2011    was nonischemic and unchanged from prior study  . MI (myocardial infarction) 07/1989    Non-Q wave   . H/O angioplasty 07/1989    with PTCA of 2 vessels  . Colon cancer 02/19/01  . Kidney stones   . Hypothyroidism   . GERD (gastroesophageal reflux disease)   . History of echocardiogram 12/06    EF 50%-55% which was normal with mild to moderate MR and a cardiolite which showed inferior scar versus diaphragmatic attenuation   . History of stress test 06.2011    showed diastolic continuation without ischemia.    Review of Systems  Genitourinary: Positive for frequency.       + Nocturia  All other systems reviewed and are negative.   BP 116/60  Pulse 56  Temp(Src) 98.2 F (36.8 C) (Temporal)  Resp 16  Ht 5\' 9"  (1.753 m)  Wt 172 lb (78.019 kg)  BMI 25.39 kg/m2     Objective:   Physical Exam  Nursing note and vitals reviewed. Constitutional: He is oriented to person, place, and time. He appears well-developed and well-nourished.  HENT:  Head: Normocephalic and atraumatic.  Right Ear: External ear normal.  Left Ear: External ear normal.  Nose: Nose normal.  Eyes: Conjunctivae and EOM are normal.  Neck: Normal range of motion. Neck supple. No JVD present. No thyromegaly present.  Cardiovascular: Normal rate, regular rhythm,  normal heart sounds and intact distal pulses.   Pulmonary/Chest: Effort normal and breath sounds normal.  Abdominal: Soft. Bowel sounds are normal. He exhibits no distension and no mass. There is no tenderness. There is no rebound and no guarding.  Musculoskeletal: Normal range of motion. He exhibits no edema and no tenderness.  Lymphadenopathy:    He has no cervical adenopathy.  Neurological: He is alert and oriented to person, place, and time. He has normal reflexes. No cranial nerve deficit. Coordination normal.  Skin: Skin is warm and dry.  Psychiatric: He has a normal mood and affect. His behavior is normal. Judgment and thought content normal.          Assessment & Plan:  1.  3 month F/U for HTN, Cholesterol, D. Deficient. Needs healthy diet, cardio QD and obtain healthy weight. Check Labs, Check BP if >130/80 call office  2. Recent prostate infection- Finish ABX AD if sx continue f/u

## 2013-07-03 NOTE — Patient Instructions (Signed)
Prostatitis Prostatitis is redness, soreness, and puffiness (swelling) of the prostate gland. The prostate gland is the walnut-sized gland located just below your bladder. HOME CARE:   Take all medicines as told by your doctor.  Take warm-water baths (sitz baths) as told by your doctor. GET HELP IF:  Your symptoms get worse, not better.  You have a fever. GET HELP RIGHT AWAY IF:   You have chills.  You feel sick to your stomach (nauseous) or like you will throw up (vomit).  You feel lightheaded or like you will pass out (faint).  You are unable to pee (urinate).  You have blood or blood clumps (clots) in your pee (urine). MAKE SURE YOU:  Understand these instructions.  Will watch your condition.  Will get help right away if you are not doing well or get worse. Document Released: 10/16/2011 Document Revised: 12/17/2012 Document Reviewed: 11/03/2012 [facility] Patient Information 2014 [place], [place].

## 2013-07-16 DIAGNOSIS — H353290 Exudative age-related macular degeneration, unspecified eye, stage unspecified: Secondary | ICD-10-CM | POA: Diagnosis not present

## 2013-08-11 DIAGNOSIS — D485 Neoplasm of uncertain behavior of skin: Secondary | ICD-10-CM | POA: Diagnosis not present

## 2013-08-11 DIAGNOSIS — L570 Actinic keratosis: Secondary | ICD-10-CM | POA: Diagnosis not present

## 2013-08-11 DIAGNOSIS — C4441 Basal cell carcinoma of skin of scalp and neck: Secondary | ICD-10-CM | POA: Diagnosis not present

## 2013-09-03 DIAGNOSIS — H353290 Exudative age-related macular degeneration, unspecified eye, stage unspecified: Secondary | ICD-10-CM | POA: Diagnosis not present

## 2013-09-15 ENCOUNTER — Other Ambulatory Visit: Payer: Self-pay | Admitting: *Deleted

## 2013-09-15 MED ORDER — NIFEDIPINE ER 30 MG PO TB24: 30.0000 mg | ORAL_TABLET | Freq: Every day | ORAL | Status: DC

## 2013-09-16 ENCOUNTER — Other Ambulatory Visit: Payer: Self-pay | Admitting: Emergency Medicine

## 2013-09-16 MED ORDER — NIFEDIPINE ER 30 MG PO TB24: 30.0000 mg | ORAL_TABLET | Freq: Every day | ORAL | Status: DC

## 2013-09-24 DIAGNOSIS — C4441 Basal cell carcinoma of skin of scalp and neck: Secondary | ICD-10-CM | POA: Diagnosis not present

## 2013-09-24 DIAGNOSIS — D485 Neoplasm of uncertain behavior of skin: Secondary | ICD-10-CM | POA: Diagnosis not present

## 2013-09-24 DIAGNOSIS — C44211 Basal cell carcinoma of skin of unspecified ear and external auricular canal: Secondary | ICD-10-CM | POA: Diagnosis not present

## 2013-09-28 ENCOUNTER — Ambulatory Visit (INDEPENDENT_AMBULATORY_CARE_PROVIDER_SITE_OTHER): Payer: Medicare Other | Admitting: Internal Medicine

## 2013-09-28 ENCOUNTER — Encounter: Payer: Self-pay | Admitting: Internal Medicine

## 2013-09-28 VITALS — BP 152/80 | HR 60 | Temp 99.3°F | Resp 16 | Ht 69.0 in | Wt 168.0 lb

## 2013-09-28 DIAGNOSIS — E559 Vitamin D deficiency, unspecified: Secondary | ICD-10-CM | POA: Diagnosis not present

## 2013-09-28 DIAGNOSIS — Z1212 Encounter for screening for malignant neoplasm of rectum: Secondary | ICD-10-CM

## 2013-09-28 DIAGNOSIS — E782 Mixed hyperlipidemia: Secondary | ICD-10-CM

## 2013-09-28 DIAGNOSIS — Z79899 Other long term (current) drug therapy: Secondary | ICD-10-CM | POA: Insufficient documentation

## 2013-09-28 DIAGNOSIS — I10 Essential (primary) hypertension: Secondary | ICD-10-CM

## 2013-09-28 DIAGNOSIS — R7309 Other abnormal glucose: Secondary | ICD-10-CM | POA: Diagnosis not present

## 2013-09-28 DIAGNOSIS — Z125 Encounter for screening for malignant neoplasm of prostate: Secondary | ICD-10-CM

## 2013-09-28 NOTE — Patient Instructions (Signed)

## 2013-09-28 NOTE — Progress Notes (Signed)
Patient ID: Martin Massey, male   DOB: [birth year], 80 y.o.   MRN: [id number]   Annual Screening Comprehensive Examination  This very nice 80 y.o.MWM presents for complete physical.  Patient has been followed for HTN, ASHD/CABG, Prediabetes, Hyperlipidemia, and Vitamin D Deficiency.   HTN predates since the 1970's. Patient's BP has been controlled at home. Today's BP: 152/80 mmHg. Patient had an acute SEMI in 1993 undergoing a PTCA.  In 2006 patient underwent a CABG for unstable angina. In 2011 he had a negative Myoview. Also in Mar 2015 his GFR calculated at 65  consistant with stage 3 CKD. Patient denies any current/recent cardiac symptoms as chest pain, palpitations, shortness of breath, dizziness or ankle swelling.   Patient's hyperlipidemia is controlled with diet and medications. Patient denies myalgias or other medication SE's. Last lipids in Mar as below.  Lab Results  Component Value Date   CHOL 118 07/03/2013   HDL 49 07/03/2013   LDLCALC 58 07/03/2013   TRIG 56 07/03/2013   CHOLHDL 2.4 07/03/2013    Patient uis monitored for prediabetes/insulin resistance  and last A1c was 5.0% in May 2014. Patient denies reactive hypoglycemic symptoms, visual blurring, diabetic polys or paresthesias.    Finally, patient has history of Vitamin D Deficiency of 34 in 2008 and last vitamin D 82 in Sept 2014.  Medication Sig  . aspirin 81 MG tab Take 81 mg  daily.  Marland Kitchen atorvastatin  20 MG tablet Take 20 mg  daily.  Marland Kitchen VITAMIN D 2000 UNITS   2 in the morning and 1 at night  . dorzolamide (TRUSOPT) 2 % ophth soln   . enalapril (VASOTEC) 20 MG tablet TAKE 1 TAB FOR    BLOOD PRESSURE  . finasteride (PROSCAR) 5 MG tablet TAKE 1 TAB DAILY  . hydrochlorothiazide  25 MG Take 25 mg  daily.  Marland Kitchen latanoprost (XALATAN) 0.005 % ophth soln   . levothyroxine  50 MCG tablet 50 mcg. 1 tab on MWF and 1 1/2 on TThSS  . MULTIVITAMIN WITH MINERALS Take 1 tablet by mouth daily.  Marland Kitchen NIFEdipine-XL 30 MG 24 hr tablet Take 1 tablet (30  mg total)  daily.  . ranitidine (ZANTAC) 150 MG capsule Take 150 mg as needed    Allergies  Allergen Reactions  . Capoten [Captopril] Cough  . Lopressor [Metoprolol] Other (See Comments)    bradycardia    Past Medical History  Diagnosis Date  . CAD (coronary artery disease)     CABG x3  . Hypertension   . Hyperlipidemia   . History of stress test 11/30/2011    was nonischemic and unchanged from prior study  . MI (myocardial infarction) 07/1989    Non-Q wave   . H/O angioplasty 07/1989    with PTCA of 2 vessels  . Colon cancer 02/19/01  . Kidney stones   . Hypothyroidism   . GERD (gastroesophageal reflux disease)   . History of echocardiogram 12/06    EF 50%-55% which was normal with mild to moderate MR and a cardiolite which showed inferior scar versus diaphragmatic attenuation   . History of stress test 06.2011    showed diastolic continuation without ischemia.    Past Surgical History  Procedure Laterality Date  . Coronary artery bypass graft       x3 by Dr Tharon Aquas Trigt with LIMA to his LAD, a vein to the circumflex and PDA..  . Cardiac catheterization  11/1991    found to have circumflex re-stenosis  which re-angioplastied.  . Colon surgery  02/19/01    at [facility] for [facility]  . Knee surgery Left 2002    By Dr Maxie Better  . Rotator cuff repair    . Cataract surgery  2002  . Cardiac catheterization  12/20/2004    revealing a 90% calcified proximal LAD lesion, 50% to 60% mid-circumflex lesion and high-grade distal tandem RCA lesions with normal LV function.    Family History  Problem Relation Age of Onset  . Heart attack Mother     History   Social History  . Marital Status: Married    Spouse Name: N/A    Number of Children: N/A  . Years of Education: N/A   Social History Main Topics  . Smoking status: Former Smoker    Quit date: 04/30/1964  . Smokeless tobacco: Not on file  . Alcohol Use: 1.0 - 1.5 oz/week    2-3 drink(s) per week  . Drug  Use: No  . Sexual Activity: Not on file    ROS Constitutional: Denies fever, chills, weight loss/gain, headaches, insomnia, fatigue, night sweats or change in appetite. Eyes: Denies redness, blurred vision, diplopia, discharge, itchy or watery eyes.  ENT: Denies discharge, congestion, post nasal drip, epistaxis, sore throat, earache, hearing loss, dental pain, Tinnitus, Vertigo, Sinus pain or snoring.  Cardio: Denies chest pain, palpitations, irregular heartbeat, syncope, dyspnea, diaphoresis, orthopnea, PND, claudication or edema Respiratory: denies cough, dyspnea, DOE, pleurisy, hoarseness, laryngitis or wheezing.  Gastrointestinal: Denies dysphagia, heartburn, reflux, water brash, pain, cramps, nausea, vomiting, bloating, diarrhea, constipation, hematemesis, melena, hematochezia, jaundice or hemorrhoids Genitourinary: Denies dysuria, frequency, urgency, nocturia, hesitancy, discharge, hematuria or flank pain Musculoskeletal: Denies arthralgia, myalgia, stiffness, Jt. Swelling, pain, limp or strain/sprain. Skin: Denies puritis, rash, hives, warts, acne, eczema or change in skin lesion Neuro: No weakness, tremor, incoordination, spasms, paresthesia or pain Psychiatric: Denies confusion, memory loss or sensory loss Endocrine: Denies change in weight, skin, hair change, nocturia, and paresthesia, diabetic polys, visual blurring or hyper / hypo glycemic episodes.  Heme/Lymph: No excessive bleeding, bruising or enlarged lymph nodes.  Physical Exam  BP 152/80  P 60  T 99.3 F   Resp 16  Ht 5\' 9"    Wt 168 lb   BMI 24.80 kg/m2  General Appearance: Well nourished, in no apparent distress. Eyes: PERRLA, EOMs, conjunctiva no swelling or erythema, normal fundi and vessels. Sinuses: No frontal/maxillary tenderness ENT/Mouth: EACs patent / TMs  nl. Nares clear without erythema, swelling, mucoid exudates. Oral hygiene is good. No erythema, swelling, or exudate. Tongue normal, non-obstructing.  Tonsils not swollen or erythematous. Hearing normal.  Neck: Supple, thyroid normal. No bruits, nodes or JVD. Respiratory: Respiratory effort normal.  BS equal and clear bilateral without rales, rhonci, wheezing or stridor. Cardio: Heart sounds are normal with regular rate and rhythm and no murmurs, rubs or gallops. Peripheral pulses are normal and equal bilaterally without edema. No aortic or femoral bruits. Chest: symmetric with normal excursions and percussion.  Abdomen: Flat, soft, with bowl sounds. Nontender, no guarding, rebound, hernias, masses, or organomegaly.  Lymphatics: Non tender without lymphadenopathy.  Genitourinary: No hernias.Testes nl. DRE -deferred for age of 57. Musculoskeletal: Full ROM all peripheral extremities, joint stability, 5/5 strength, and normal gait. Skin: Warm and dry without rashes, lesions, cyanosis, clubbing or  ecchymosis.  Neuro: Cranial nerves intact, reflexes equal bilaterally. Normal muscle tone, no cerebellar symptoms. Sensation intact.  Pysch: Awake and oriented X 3, normal affect, insight and judgment appropriate.   Assessment  and Plan  1. Annual Screening Examination 2. Hypertension  3. Hyperlipidemia 4. Pre Diabetes 5. Vitamin D Deficiency 6. ASHD / CABG  Continue prudent diet as discussed, weight control, BP monitoring, regular exercise, and medications as discussed.  Discussed med effects and SE's. Routine screening labs and tests as requested with regular follow-up as recommended.

## 2013-10-05 ENCOUNTER — Other Ambulatory Visit: Payer: Self-pay | Admitting: Internal Medicine

## 2013-10-05 ENCOUNTER — Other Ambulatory Visit (INDEPENDENT_AMBULATORY_CARE_PROVIDER_SITE_OTHER): Payer: Medicare Other

## 2013-10-05 DIAGNOSIS — Z1212 Encounter for screening for malignant neoplasm of rectum: Secondary | ICD-10-CM

## 2013-10-05 DIAGNOSIS — E782 Mixed hyperlipidemia: Secondary | ICD-10-CM

## 2013-10-05 DIAGNOSIS — E559 Vitamin D deficiency, unspecified: Secondary | ICD-10-CM

## 2013-10-05 DIAGNOSIS — R7309 Other abnormal glucose: Secondary | ICD-10-CM

## 2013-10-05 DIAGNOSIS — Z125 Encounter for screening for malignant neoplasm of prostate: Secondary | ICD-10-CM

## 2013-10-05 DIAGNOSIS — I10 Essential (primary) hypertension: Secondary | ICD-10-CM

## 2013-10-05 DIAGNOSIS — Z79899 Other long term (current) drug therapy: Secondary | ICD-10-CM

## 2013-10-05 LAB — POC HEMOCCULT BLD/STL (HOME/3-CARD/SCREEN)
Card #2 Fecal Occult Blod, POC: NEGATIVE
Card #3 Fecal Occult Blood, POC: NEGATIVE
FECAL OCCULT BLD: NEGATIVE

## 2013-10-06 ENCOUNTER — Other Ambulatory Visit: Payer: Medicare Other

## 2013-10-06 DIAGNOSIS — R7309 Other abnormal glucose: Secondary | ICD-10-CM

## 2013-10-06 DIAGNOSIS — Z79899 Other long term (current) drug therapy: Secondary | ICD-10-CM | POA: Diagnosis not present

## 2013-10-06 DIAGNOSIS — Z125 Encounter for screening for malignant neoplasm of prostate: Secondary | ICD-10-CM | POA: Diagnosis not present

## 2013-10-06 DIAGNOSIS — E782 Mixed hyperlipidemia: Secondary | ICD-10-CM | POA: Diagnosis not present

## 2013-10-06 DIAGNOSIS — I10 Essential (primary) hypertension: Secondary | ICD-10-CM

## 2013-10-06 DIAGNOSIS — E559 Vitamin D deficiency, unspecified: Secondary | ICD-10-CM | POA: Diagnosis not present

## 2013-10-06 LAB — CBC WITH DIFFERENTIAL/PLATELET
Basophils Absolute: 0.1 10*3/uL (ref 0.0–0.1)
Basophils Relative: 1 % (ref 0–1)
EOS PCT: 4 % (ref 0–5)
Eosinophils Absolute: 0.2 10*3/uL (ref 0.0–0.7)
HCT: 37.7 % — ABNORMAL LOW (ref 39.0–52.0)
HEMOGLOBIN: 12.9 g/dL — AB (ref 13.0–17.0)
LYMPHS ABS: 1.8 10*3/uL (ref 0.7–4.0)
Lymphocytes Relative: 34 % (ref 12–46)
MCH: 30.6 pg (ref 26.0–34.0)
MCHC: 34.2 g/dL (ref 30.0–36.0)
MCV: 89.5 fL (ref 78.0–100.0)
MONOS PCT: 8 % (ref 3–12)
Monocytes Absolute: 0.4 10*3/uL (ref 0.1–1.0)
NEUTROS PCT: 53 % (ref 43–77)
Neutro Abs: 2.8 10*3/uL (ref 1.7–7.7)
Platelets: 183 10*3/uL (ref 150–400)
RBC: 4.21 MIL/uL — ABNORMAL LOW (ref 4.22–5.81)
RDW: 14.3 % (ref 11.5–15.5)
WBC: 5.2 10*3/uL (ref 4.0–10.5)

## 2013-10-06 LAB — HEMOGLOBIN A1C
HEMOGLOBIN A1C: 5.1 % (ref <5.7)
Mean Plasma Glucose: 100 mg/dL (ref <117)

## 2013-10-07 LAB — TSH: TSH: 3.251 u[IU]/mL (ref 0.350–4.500)

## 2013-10-07 LAB — MICROALBUMIN / CREATININE URINE RATIO
Creatinine, Urine: 102 mg/dL
MICROALB/CREAT RATIO: 4.9 mg/g (ref 0.0–30.0)
Microalb, Ur: 0.5 mg/dL (ref 0.00–1.89)

## 2013-10-07 LAB — URINALYSIS, ROUTINE W REFLEX MICROSCOPIC
Bilirubin Urine: NEGATIVE
Glucose, UA: NEGATIVE mg/dL
Hgb urine dipstick: NEGATIVE
Ketones, ur: NEGATIVE mg/dL
Leukocytes, UA: NEGATIVE
NITRITE: NEGATIVE
PH: 7 (ref 5.0–8.0)
Protein, ur: NEGATIVE mg/dL
Specific Gravity, Urine: 1.019 (ref 1.005–1.030)
Urobilinogen, UA: 0.2 mg/dL (ref 0.0–1.0)

## 2013-10-07 LAB — PSA: PSA: 0.15 ng/mL (ref <=4.00)

## 2013-10-07 LAB — BASIC METABOLIC PANEL WITH GFR
BUN: 26 mg/dL — ABNORMAL HIGH (ref 6–23)
CALCIUM: 9.6 mg/dL (ref 8.4–10.5)
CO2: 26 mEq/L (ref 19–32)
Chloride: 105 mEq/L (ref 96–112)
Creat: 1.03 mg/dL (ref 0.50–1.35)
GFR, Est African American: 73 mL/min
GFR, Est Non African American: 63 mL/min
Glucose, Bld: 92 mg/dL (ref 70–99)
Potassium: 4.2 mEq/L (ref 3.5–5.3)
SODIUM: 139 meq/L (ref 135–145)

## 2013-10-07 LAB — HEPATIC FUNCTION PANEL
ALT: 16 U/L (ref 0–53)
AST: 18 U/L (ref 0–37)
Albumin: 3.7 g/dL (ref 3.5–5.2)
Alkaline Phosphatase: 89 U/L (ref 39–117)
BILIRUBIN DIRECT: 0.2 mg/dL (ref 0.0–0.3)
Indirect Bilirubin: 0.5 mg/dL (ref 0.2–1.2)
Total Bilirubin: 0.7 mg/dL (ref 0.2–1.2)
Total Protein: 6.1 g/dL (ref 6.0–8.3)

## 2013-10-07 LAB — LIPID PANEL
Cholesterol: 130 mg/dL (ref 0–200)
HDL: 54 mg/dL (ref >39)
LDL Cholesterol: 61 mg/dL (ref 0–99)
Total CHOL/HDL Ratio: 2.4 Ratio
Triglycerides: 76 mg/dL (ref <150)
VLDL: 15 mg/dL (ref 0–40)

## 2013-10-07 LAB — MAGNESIUM: MAGNESIUM: 2.1 mg/dL (ref 1.5–2.5)

## 2013-10-07 LAB — VITAMIN D 25 HYDROXY (VIT D DEFICIENCY, FRACTURES): VIT D 25 HYDROXY: 88 ng/mL (ref 30–89)

## 2013-10-07 LAB — INSULIN, FASTING: INSULIN FASTING, SERUM: 18 u[IU]/mL (ref 3–28)

## 2013-10-22 DIAGNOSIS — H31019 Macula scars of posterior pole (postinflammatory) (post-traumatic), unspecified eye: Secondary | ICD-10-CM | POA: Diagnosis not present

## 2013-10-22 DIAGNOSIS — H4010X0 Unspecified open-angle glaucoma, stage unspecified: Secondary | ICD-10-CM | POA: Diagnosis not present

## 2013-10-22 DIAGNOSIS — H353290 Exudative age-related macular degeneration, unspecified eye, stage unspecified: Secondary | ICD-10-CM | POA: Diagnosis not present

## 2013-10-22 DIAGNOSIS — Z961 Presence of intraocular lens: Secondary | ICD-10-CM | POA: Diagnosis not present

## 2013-10-22 DIAGNOSIS — H409 Unspecified glaucoma: Secondary | ICD-10-CM | POA: Diagnosis not present

## 2013-10-23 DIAGNOSIS — H4011X0 Primary open-angle glaucoma, stage unspecified: Secondary | ICD-10-CM | POA: Diagnosis not present

## 2013-10-23 DIAGNOSIS — H353290 Exudative age-related macular degeneration, unspecified eye, stage unspecified: Secondary | ICD-10-CM | POA: Diagnosis not present

## 2013-10-25 ENCOUNTER — Other Ambulatory Visit: Payer: Self-pay | Admitting: Internal Medicine

## 2013-12-01 ENCOUNTER — Other Ambulatory Visit: Payer: Self-pay | Admitting: *Deleted

## 2013-12-01 MED ORDER — LEVOTHYROXINE SODIUM 50 MCG PO TABS: 50.0000 ug | ORAL_TABLET | Freq: Every day | ORAL | Status: DC

## 2013-12-10 DIAGNOSIS — H409 Unspecified glaucoma: Secondary | ICD-10-CM | POA: Diagnosis not present

## 2013-12-10 DIAGNOSIS — H4010X0 Unspecified open-angle glaucoma, stage unspecified: Secondary | ICD-10-CM | POA: Diagnosis not present

## 2013-12-10 DIAGNOSIS — H353290 Exudative age-related macular degeneration, unspecified eye, stage unspecified: Secondary | ICD-10-CM | POA: Diagnosis not present

## 2013-12-10 DIAGNOSIS — H31019 Macula scars of posterior pole (postinflammatory) (post-traumatic), unspecified eye: Secondary | ICD-10-CM | POA: Diagnosis not present

## 2013-12-10 DIAGNOSIS — Z961 Presence of intraocular lens: Secondary | ICD-10-CM | POA: Diagnosis not present

## 2014-01-11 ENCOUNTER — Ambulatory Visit: Payer: Self-pay | Admitting: Physician Assistant

## 2014-01-18 ENCOUNTER — Ambulatory Visit (INDEPENDENT_AMBULATORY_CARE_PROVIDER_SITE_OTHER): Payer: Medicare Other | Admitting: Physician Assistant

## 2014-01-18 ENCOUNTER — Encounter: Payer: Self-pay | Admitting: Physician Assistant

## 2014-01-18 VITALS — BP 140/80 | HR 56 | Temp 98.1°F | Resp 16 | Ht 69.0 in | Wt 169.0 lb

## 2014-01-18 DIAGNOSIS — E782 Mixed hyperlipidemia: Secondary | ICD-10-CM | POA: Diagnosis not present

## 2014-01-18 DIAGNOSIS — I10 Essential (primary) hypertension: Secondary | ICD-10-CM | POA: Diagnosis not present

## 2014-01-18 DIAGNOSIS — R7309 Other abnormal glucose: Secondary | ICD-10-CM | POA: Diagnosis not present

## 2014-01-18 DIAGNOSIS — H6120 Impacted cerumen, unspecified ear: Secondary | ICD-10-CM | POA: Diagnosis not present

## 2014-01-18 DIAGNOSIS — Z79899 Other long term (current) drug therapy: Secondary | ICD-10-CM | POA: Diagnosis not present

## 2014-01-18 DIAGNOSIS — E039 Hypothyroidism, unspecified: Secondary | ICD-10-CM | POA: Diagnosis not present

## 2014-01-18 DIAGNOSIS — E559 Vitamin D deficiency, unspecified: Secondary | ICD-10-CM

## 2014-01-18 LAB — HEMOGLOBIN A1C
HEMOGLOBIN A1C: 5.5 % (ref <5.7)
Mean Plasma Glucose: 111 mg/dL (ref <117)

## 2014-01-18 LAB — HEPATIC FUNCTION PANEL
ALK PHOS: 78 U/L (ref 39–117)
ALT: 15 U/L (ref 0–53)
AST: 20 U/L (ref 0–37)
Albumin: 4 g/dL (ref 3.5–5.2)
BILIRUBIN INDIRECT: 0.6 mg/dL (ref 0.2–1.2)
Bilirubin, Direct: 0.2 mg/dL (ref 0.0–0.3)
Total Bilirubin: 0.8 mg/dL (ref 0.2–1.2)
Total Protein: 6.3 g/dL (ref 6.0–8.3)

## 2014-01-18 LAB — BASIC METABOLIC PANEL WITH GFR
BUN: 25 mg/dL — AB (ref 6–23)
CHLORIDE: 105 meq/L (ref 96–112)
CO2: 26 mEq/L (ref 19–32)
Calcium: 9.7 mg/dL (ref 8.4–10.5)
Creat: 1.08 mg/dL (ref 0.50–1.35)
GFR, Est African American: 69 mL/min
GFR, Est Non African American: 60 mL/min
GLUCOSE: 83 mg/dL (ref 70–99)
POTASSIUM: 4.1 meq/L (ref 3.5–5.3)
Sodium: 139 mEq/L (ref 135–145)

## 2014-01-18 LAB — CBC WITH DIFFERENTIAL/PLATELET
Basophils Absolute: 0.1 10*3/uL (ref 0.0–0.1)
Basophils Relative: 1 % (ref 0–1)
Eosinophils Absolute: 0.2 10*3/uL (ref 0.0–0.7)
Eosinophils Relative: 4 % (ref 0–5)
HCT: 39.4 % (ref 39.0–52.0)
HEMOGLOBIN: 13.3 g/dL (ref 13.0–17.0)
LYMPHS PCT: 39 % (ref 12–46)
Lymphs Abs: 2.1 10*3/uL (ref 0.7–4.0)
MCH: 30.7 pg (ref 26.0–34.0)
MCHC: 33.8 g/dL (ref 30.0–36.0)
MCV: 91 fL (ref 78.0–100.0)
MONO ABS: 0.5 10*3/uL (ref 0.1–1.0)
MONOS PCT: 9 % (ref 3–12)
NEUTROS ABS: 2.6 10*3/uL (ref 1.7–7.7)
Neutrophils Relative %: 47 % (ref 43–77)
Platelets: 177 10*3/uL (ref 150–400)
RBC: 4.33 MIL/uL (ref 4.22–5.81)
RDW: 13.9 % (ref 11.5–15.5)
WBC: 5.5 10*3/uL (ref 4.0–10.5)

## 2014-01-18 LAB — LIPID PANEL
Cholesterol: 119 mg/dL (ref 0–200)
HDL: 58 mg/dL (ref >39)
LDL CALC: 51 mg/dL (ref 0–99)
Total CHOL/HDL Ratio: 2.1 Ratio
Triglycerides: 52 mg/dL (ref <150)
VLDL: 10 mg/dL (ref 0–40)

## 2014-01-18 LAB — MAGNESIUM: Magnesium: 2 mg/dL (ref 1.5–2.5)

## 2014-01-18 LAB — TSH: TSH: 2.706 u[IU]/mL (ref 0.350–4.500)

## 2014-01-18 NOTE — Patient Instructions (Addendum)
Do only 2 Vitamin D a day Stop the lipitor/atrovastatin  Can do the the fluids pill every other day

## 2014-01-18 NOTE — Progress Notes (Signed)
Assessment and Plan:  Hypertension: Continue medication, monitor blood pressure at home. Continue DASH diet. Cholesterol: Continue diet and exercise. Check cholesterol. Will discontinue lipitor due to age Pre-diabetes-Continue diet and exercise. Check A1C Vitamin D Def- check level and continue medications.  Cerumen impaction- removal in office, tolerated well  Continue diet and meds as discussed. Further disposition pending results of labs.  HPI 80 y.o. male  presents for 3 month follow up with hypertension, hyperlipidemia, prediabetes and vitamin D. His blood pressure has been controlled at home, today their BP is BP: 140/80 mmHg He does not workout. He denies chest pain, shortness of breath, dizziness.  He is on cholesterol medication and denies myalgias. His cholesterol is at goal. The cholesterol last visit was:   Lab Results  Component Value Date   CHOL 130 10/06/2013   HDL 54 10/06/2013   LDLCALC 61 10/06/2013   TRIG 76 10/06/2013   CHOLHDL 2.4 10/06/2013   Last A1C in the office was:  Lab Results  Component Value Date   HGBA1C 5.1 10/06/2013   Patient is on Vitamin D supplement.   Lab Results  Component Value Date   VD25OH 49 10/06/2013     He is on thyroid medication. His medication was not changed last visit. Patient denies nervousness, palpitations and weight changes.  Lab Results  Component Value Date   TSH 3.251 10/06/2013  .     Current Medications:  Current Outpatient Prescriptions on File Prior to Visit  Medication Sig Dispense Refill  . aspirin 81 MG tablet Take 81 mg by mouth daily.      Marland Kitchen atorvastatin (LIPITOR) 20 MG tablet Take 20 mg by mouth daily.      . brimonidine-timolol (COMBIGAN) 0.2-0.5 % ophthalmic solution Place 1 drop into both eyes every 12 (twelve) hours.      . Cholecalciferol (VITAMIN D) 2000 UNITS CAPS Take by mouth. 2 in the morning and 1 at night      . dorzolamide (TRUSOPT) 2 % ophthalmic solution       . enalapril (VASOTEC) 20 MG tablet TAKE 1  TABLET DAILY FOR    BLOOD PRESSURE  90 tablet  0  . finasteride (PROSCAR) 5 MG tablet TAKE 1 TABLET DAILY  90 tablet  2  . hydrochlorothiazide (HYDRODIURIL) 25 MG tablet TAKE 1 TABLET DAILY FOR    BLOOD PRESSURE AND FLUID  90 tablet  1  . ibuprofen (ADVIL,MOTRIN) 200 MG tablet Take 200 mg by mouth every 6 (six) hours as needed.      . latanoprost (XALATAN) 0.005 % ophthalmic solution       . levothyroxine (SYNTHROID, LEVOTHROID) 50 MCG tablet Take 1 tablet (50 mcg total) by mouth daily before breakfast. Take 1-2 tabs before breakfast daily or as directed  180 tablet  99  . Multiple Vitamin (MULTIVITAMIN WITH MINERALS) TABS tablet Take 1 tablet by mouth daily.      Marland Kitchen NIFEdipine (PROCARDIA-XL/ADALAT CC) 30 MG 24 hr tablet Take 1 tablet (30 mg total) by mouth daily.  90 tablet  1  . OVER THE COUNTER MEDICATION 2 capsules 2 (two) times daily. preservision      . ranitidine (ZANTAC) 150 MG capsule Take 150 mg by mouth as needed for heartburn.       No current facility-administered medications on file prior to visit.   Medical History:  Past Medical History  Diagnosis Date  . CAD (coronary artery disease)     CABG x3  . Hypertension   .  Hyperlipidemia   . History of stress test 11/30/2011    was nonischemic and unchanged from prior study  . MI (myocardial infarction) 07/1989    Non-Q wave   . H/O angioplasty 07/1989    with PTCA of 2 vessels  . Colon cancer 02/19/01  . Kidney stones   . Hypothyroidism   . GERD (gastroesophageal reflux disease)   . History of echocardiogram 12/06    EF 50%-55% which was normal with mild to moderate MR and a cardiolite which showed inferior scar versus diaphragmatic attenuation   . History of stress test 06.2011    showed diastolic continuation without ischemia.   Allergies:  Allergies  Allergen Reactions  . Capoten [Captopril] Cough  . Lopressor [Metoprolol] Other (See Comments)    bradycardia     Review of Systems: [X]  = complains of  [ ]  =  denies  General: Fatigue [ ]  Fever [ ]  Chills [ ]  Weakness [ ]   Insomnia [ ]  Eyes: Redness [ ]  Blurred vision [ ]  Diplopia [ ]   ENT: Congestion [ ]  Sinus Pain [ ]  Post Nasal Drip [ ]  Sore Throat [ ]  Earache [ ]   Cardiac: Chest pain/pressure [ ]  SOB [ ]  Orthopnea [ ]   Palpitations [ ]   Paroxysmal nocturnal dyspnea[ ]  Claudication [ ]  Edema [ ]   Pulmonary: Cough [ ]  Wheezing[ ]   SOB [ ]   Snoring [ ]   GI: Nausea [ ]  Vomiting[ ]  Dysphagia[ ]  Heartburn[ ]  Abdominal pain [ ]  Constipation [ ] ; Diarrhea [ ] ; BRBPR [ ]  Melena[ ]  GU: Hematuria[ ]  Dysuria [ ]  Nocturia[ ]  Urgency [ ]   Hesitancy [ ]  Discharge [ ]  Neuro: Headaches[ ]  Vertigo[ ]  Paresthesias[ ]  Spasm [ ]  Speech changes [ ]  Incoordination [ ]   Ortho: Arthritis [ ]  Joint pain [ ]  Muscle pain [ ]  Joint swelling [ ]  Back Pain [ ]  Skin:  Rash [ ]   Pruritis [ ]  Change in skin lesion [ ]   Psych: Depression[ ]  Anxiety[ ]  Confusion [ ]  Memory loss [ ]   Heme/Lypmh: Bleeding [ ]  Bruising [ ]  Enlarged lymph nodes [ ]   Endocrine: Visual blurring [ ]  Paresthesia [ ]  Polyuria [ ]  Polydypsea [ ]    Heat/cold intolerance [ ]  Hypoglycemia [ ]   Family history- Review and unchanged Social history- Review and unchanged Physical Exam: BP 140/80  Pulse 56  Temp(Src) 98.1 F (36.7 C)  Resp 16  Ht 5\' 9"  (1.753 m)  Wt 169 lb (76.658 kg)  BMI 24.95 kg/m2 Wt Readings from Last 3 Encounters:  01/18/14 169 lb (76.658 kg)  09/28/13 168 lb (76.204 kg)  07/03/13 172 lb (78.019 kg)   General Appearance: Well nourished, in no apparent distress. Eyes: PERRLA, EOMs, conjunctiva no swelling or erythema Sinuses: No Frontal/maxillary tenderness ENT/Mouth: Ext aud canals clear except left which was cleaned in the visit, TMs without erythema, bulging. No erythema, swelling, or exudate on post pharynx.  Tonsils not swollen or erythematous. Hearing decreased Neck: Supple, thyroid normal.  Respiratory: Respiratory effort normal, BS equal bilaterally without rales,  rhonchi, wheezing or stridor.  Cardio: RRR with no MRGs. Brisk peripheral pulses without edema.  Abdomen: Soft, + BS.  Non tender, no guarding, rebound, hernias, masses. Lymphatics: Non tender without lymphadenopathy.  Musculoskeletal: Full ROM, 5/5 strength, gait slow and slightly unsteady Skin: Warm, dry without rashes, lesions, ecchymosis.  Neuro: Cranial nerves intact. Normal muscle tone, no cerebellar symptoms. Sensation intact.  Psych: Awake and oriented X 3, normal affect,  Insight and Judgment appropriate.    Vicie Mutters 12:26 PM

## 2014-01-26 DIAGNOSIS — Z961 Presence of intraocular lens: Secondary | ICD-10-CM | POA: Diagnosis not present

## 2014-01-26 DIAGNOSIS — H4011X0 Primary open-angle glaucoma, stage unspecified: Secondary | ICD-10-CM | POA: Diagnosis not present

## 2014-01-26 DIAGNOSIS — H353290 Exudative age-related macular degeneration, unspecified eye, stage unspecified: Secondary | ICD-10-CM | POA: Diagnosis not present

## 2014-01-26 DIAGNOSIS — H01029 Squamous blepharitis unspecified eye, unspecified eyelid: Secondary | ICD-10-CM | POA: Diagnosis not present

## 2014-02-03 DIAGNOSIS — Z23 Encounter for immunization: Secondary | ICD-10-CM | POA: Diagnosis not present

## 2014-02-10 DIAGNOSIS — Z85828 Personal history of other malignant neoplasm of skin: Secondary | ICD-10-CM | POA: Diagnosis not present

## 2014-02-10 DIAGNOSIS — C44229 Squamous cell carcinoma of skin of left ear and external auricular canal: Secondary | ICD-10-CM | POA: Diagnosis not present

## 2014-02-10 DIAGNOSIS — D492 Neoplasm of unspecified behavior of bone, soft tissue, and skin: Secondary | ICD-10-CM | POA: Diagnosis not present

## 2014-02-10 DIAGNOSIS — C44311 Basal cell carcinoma of skin of nose: Secondary | ICD-10-CM | POA: Diagnosis not present

## 2014-02-10 DIAGNOSIS — L570 Actinic keratosis: Secondary | ICD-10-CM | POA: Diagnosis not present

## 2014-02-18 DIAGNOSIS — H3532 Exudative age-related macular degeneration: Secondary | ICD-10-CM | POA: Diagnosis not present

## 2014-02-18 DIAGNOSIS — Z961 Presence of intraocular lens: Secondary | ICD-10-CM | POA: Diagnosis not present

## 2014-02-18 DIAGNOSIS — H4010X0 Unspecified open-angle glaucoma, stage unspecified: Secondary | ICD-10-CM | POA: Diagnosis not present

## 2014-02-18 DIAGNOSIS — H31012 Macula scars of posterior pole (postinflammatory) (post-traumatic), left eye: Secondary | ICD-10-CM | POA: Diagnosis not present

## 2014-02-28 ENCOUNTER — Other Ambulatory Visit: Payer: Self-pay | Admitting: Emergency Medicine

## 2014-02-28 ENCOUNTER — Other Ambulatory Visit: Payer: Self-pay | Admitting: Internal Medicine

## 2014-03-09 DIAGNOSIS — C44311 Basal cell carcinoma of skin of nose: Secondary | ICD-10-CM | POA: Diagnosis not present

## 2014-03-09 DIAGNOSIS — C44229 Squamous cell carcinoma of skin of left ear and external auricular canal: Secondary | ICD-10-CM | POA: Diagnosis not present

## 2014-03-29 ENCOUNTER — Other Ambulatory Visit: Payer: Self-pay | Admitting: *Deleted

## 2014-03-29 MED ORDER — FINASTERIDE 5 MG PO TABS: 5.0000 mg | ORAL_TABLET | Freq: Every day | ORAL | Status: DC

## 2014-04-16 ENCOUNTER — Ambulatory Visit: Payer: Self-pay | Admitting: Internal Medicine

## 2014-04-21 ENCOUNTER — Ambulatory Visit: Payer: Self-pay | Admitting: Internal Medicine

## 2014-05-04 ENCOUNTER — Encounter: Payer: Self-pay | Admitting: Internal Medicine

## 2014-05-04 ENCOUNTER — Ambulatory Visit (INDEPENDENT_AMBULATORY_CARE_PROVIDER_SITE_OTHER): Payer: Medicare Other | Admitting: Internal Medicine

## 2014-05-04 VITALS — BP 138/84 | HR 52 | Temp 98.1°F | Resp 16 | Ht 69.0 in | Wt 165.6 lb

## 2014-05-04 DIAGNOSIS — Z9181 History of falling: Secondary | ICD-10-CM

## 2014-05-04 DIAGNOSIS — R6889 Other general symptoms and signs: Secondary | ICD-10-CM

## 2014-05-04 DIAGNOSIS — Z0000 Encounter for general adult medical examination without abnormal findings: Secondary | ICD-10-CM

## 2014-05-04 DIAGNOSIS — E782 Mixed hyperlipidemia: Secondary | ICD-10-CM | POA: Diagnosis not present

## 2014-05-04 DIAGNOSIS — Z0001 Encounter for general adult medical examination with abnormal findings: Secondary | ICD-10-CM

## 2014-05-04 DIAGNOSIS — Z1331 Encounter for screening for depression: Secondary | ICD-10-CM

## 2014-05-04 DIAGNOSIS — E559 Vitamin D deficiency, unspecified: Secondary | ICD-10-CM | POA: Diagnosis not present

## 2014-05-04 DIAGNOSIS — Z79899 Other long term (current) drug therapy: Secondary | ICD-10-CM

## 2014-05-04 DIAGNOSIS — R7309 Other abnormal glucose: Secondary | ICD-10-CM | POA: Diagnosis not present

## 2014-05-04 DIAGNOSIS — I10 Essential (primary) hypertension: Secondary | ICD-10-CM | POA: Diagnosis not present

## 2014-05-04 DIAGNOSIS — R7303 Prediabetes: Secondary | ICD-10-CM

## 2014-05-04 LAB — CBC WITH DIFFERENTIAL/PLATELET
BASOS PCT: 0 % (ref 0–1)
Basophils Absolute: 0 10*3/uL (ref 0.0–0.1)
Eosinophils Absolute: 0.2 10*3/uL (ref 0.0–0.7)
Eosinophils Relative: 3 % (ref 0–5)
HCT: 43.2 % (ref 39.0–52.0)
Hemoglobin: 14.5 g/dL (ref 13.0–17.0)
Lymphocytes Relative: 40 % (ref 12–46)
Lymphs Abs: 2.2 10*3/uL (ref 0.7–4.0)
MCH: 31.1 pg (ref 26.0–34.0)
MCHC: 33.6 g/dL (ref 30.0–36.0)
MCV: 92.7 fL (ref 78.0–100.0)
MPV: 9.7 fL (ref 8.6–12.4)
Monocytes Absolute: 0.4 10*3/uL (ref 0.1–1.0)
Monocytes Relative: 8 % (ref 3–12)
NEUTROS PCT: 49 % (ref 43–77)
Neutro Abs: 2.7 10*3/uL (ref 1.7–7.7)
PLATELETS: 195 10*3/uL (ref 150–400)
RBC: 4.66 MIL/uL (ref 4.22–5.81)
RDW: 14.1 % (ref 11.5–15.5)
WBC: 5.5 10*3/uL (ref 4.0–10.5)

## 2014-05-04 NOTE — Progress Notes (Signed)
Patient ID: Martin Massey, male   DOB: [birth year], 81 y.o.   MRN: [id number]  MEDICARE ANNUAL WELLNESS VISIT AND OV  Assessment:   1. Essential hypertension   - TSH  2. Hyperlipidemia  - Lipid panel  3. Prediabetes  - Hemoglobin A1c - Insulin, fasting  4. Vitamin D deficiency  - Vit D  25 hydroxy (rtn osteoporosis monitoring)  5. Medication management  - CBC with Differential - BASIC METABOLIC PANEL WITH GFR - Hepatic function panel - Magnesium  6. Depression screen   7. At low risk for fall       Plan:   During the course of the visit the patient was educated and counseled about appropriate screening and preventive services including:    Pneumococcal vaccine   Influenza vaccine  Td vaccine  Screening electrocardiogram  Bone densitometry screening  Colorectal cancer screening  Diabetes screening  Glaucoma screening  Nutrition counseling   Advanced directives: requested  Screening recommendations, referrals:  Immunization History  Administered Date(s) Administered  . DTaP 08/14/2011  . Influenza Whole 01/20/2013  . Pneumococcal Polysaccharide-23 09/19/2012  . Zoster 08/28/2005  Prevnar vaccine deferred til available   Hep B vaccine not indicated  Nutrition assessed and recommended  Colonoscopy 07/2010 (Age 81 yo)  Recommended yearly ophthalmology/optometry visit for glaucoma screening and checkup Recommended yearly dental visit for hygiene and checkup Advanced directives - yes  Conditions/risks identified: BMI: Discussed weight loss, diet, and increase physical activity.  Increase physical activity: AHA recommends 150 minutes of physical activity a week.  Medications reviewed He is screened for PreDiabetes, ACE/ARB therapy: Yes. Urinary Incontinence is not an issue: discussed non pharmacology and pharmacology options.  Fall risk: low- discussed PT, home fall assessment, medications.    Subjective:  Martin Massey is a 81 y.o.  MWM who presents for Medicare Annual Wellness Visit and OV.  Date of last medicare wellness visit is unknown.  He has had elevated blood pressure since the 1970's. His blood pressure has been controlled at home, today their BP is BP: 138/84 mmHg  He does have hx/o Acute SEMI in 1993 undergoing PTCA & in 2006 he had a CABG. Myoview was Neg in 2011.   He does workout. He denies chest pain, shortness of breath, dizziness.    He is on cholesterol medication and denies myalgias. His cholesterol is at goal. The cholesterol last visit was:  Lab Results  Component Value Date   CHOL 119 01/18/2014   HDL 58 01/18/2014   LDLCALC 51 01/18/2014   TRIG 52 01/18/2014   CHOLHDL 2.1 01/18/2014   He has had is screened for prediabetes & glucoses & A1c' have been in the Nl range. He has been working on diet and exercise  and denies foot ulcerations, hyperglycemia, nausea, paresthesia of the feet, polydipsia, polyuria and visual disturbances.   Last A1C in the office was:  Lab Results  Component Value Date   HGBA1C 5.5 01/18/2014   Patient is on Vitamin D supplement.  (Vit D was 34 in 2008) Lab Results  Component Value Date   VD25OH 88 10/06/2013      Names of Other Physician/Practitioners you currently use: 1. [facility] here for primary care 2. Dr Katy Fitch & Renford Dills, eye doctor, last visit 3 mo/ 6 mo 3. Dr Kalman Shan, [place], dentist, last visit every 6 months  Patient Care Team: Unk Pinto, MD as PCP - General (Internal Medicine) Clent Jacks, MD as Consulting Physician (Ophthalmology) Dorna Mai.  Kalman Shan, DDS (Dentistry)  Medication Review: Medication Sig  . aspirin 81 MG tablet Take 81 mg by mouth daily.  Marland Kitchen atorvastatin (LIPITOR) 20 MG tablet Take 20 mg by mouth daily.  . brimonidine-timolol (COMBIGAN) 0.2-0.5 % ophthalmic solution Place 1 drop into both eyes every 12 (twelve) hours.  . Cholecalciferol (VITAMIN D) 2000 UNITS CAPS Take by mouth. 2 in the  morning and 1 at night  . dorzolamide (TRUSOPT) 2 % ophthalmic solution   . enalapril (VASOTEC) 20 MG tablet TAKE 1 TABLET DAILY FOR    BLOOD PRESSURE  . finasteride (PROSCAR) 5 MG tablet Take 1 tablet (5 mg total) by mouth daily.  . hydrochlorothiazide (HYDRODIURIL) 25 MG tablet TAKE 1 TABLET DAILY FOR    BLOOD PRESSURE AND FLUID  . ibuprofen (ADVIL,MOTRIN) 200 MG tablet Take 200 mg by mouth every 6 (six) hours as needed.  . latanoprost (XALATAN) 0.005 % ophthalmic solution   . Multiple Vitamin (MULTIVITAMIN WITH MINERALS) TABS tablet Take 1 tablet by mouth daily.  Marland Kitchen NIFEdipine (PROCARDIA-XL/ADALAT CC) 30 MG 24 hr tablet Take 1 tablet (30 mg total) by mouth daily.  Marland Kitchen OVER THE COUNTER MEDICATION 2 capsules 2 (two) times daily. preservision  . ranitidine (ZANTAC) 150 MG capsule Take 150 mg by mouth as needed for heartburn.  . levothyroxine (SYNTHROID, LEVOTHROID) 50 MCG tablet Take 1 tablet (50 mcg total) by mouth daily before breakfast. Take 1-2 tabs before breakfast daily or as directed   Current Problems (verified) Patient Active Problem List   Diagnosis Date Noted  . Medication management 09/28/2013  . Essential hypertension 03/30/2013  . Hyperlipidemia 03/30/2013  . Prediabetes 03/30/2013  . Vitamin D deficiency 03/30/2013  . DJD 03/30/2013  . ASHD / CABG 03/30/2013  . Hypothyroidism 03/30/2013   Screening Tests Health Maintenance  Topic Date Due  . TETANUS/TDAP  05/19/1939  . INFLUENZA VACCINE  11/28/2013  . COLONOSCOPY  07/30/2020  . PNEUMOCOCCAL POLYSACCHARIDE VACCINE AGE 79 AND OVER  Completed  . ZOSTAVAX  Completed   Immunization History  Administered Date(s) Administered  . DTaP 08/14/2011  . Influenza Whole 01/20/2013  . Pneumococcal Polysaccharide-23 09/19/2012  . Zoster 08/28/2005   Preventative care: Last colonoscopy: 07/2010 (age 81 yo)  Prior vaccinations:  Immunization History  Administered Date(s) Administered  . DTaP 08/14/2011  . Influenza Whole  01/20/2013  . Pneumococcal Polysaccharide-23 09/19/2012  . Zoster 08/28/2005   History reviewed: allergies, current medications, past family history, past medical history, past social history, past surgical history and problem list  Risk Factors: Tobacco History  Substance Use Topics  . Smoking status: Former Smoker    Quit date: 04/30/1964  . Smokeless tobacco: Not on file  . Alcohol Use: 1.0 - 1.5 oz/week    2-3 drink(s) per week   He does not smoke.  Patient is a former smoker. Are there smokers in your home (other than you)?  No  Alcohol Current alcohol use: none  Caffeine Current caffeine use: coffee 3 cups /day  Exercise Current exercise: cardiovascular workout on exercise equipment, walking and yard work  Nutrition/Diet Current diet: in general, a "healthy" diet    Cardiac risk factors: advanced age (older than 38 for men, 71 for women), dyslipidemia, hypertension, male gender and smoking/ tobacco exposure.  Depression Screen (Note: if answer to either of the following is "Yes", a more complete depression screening is indicated)   Q1: Over the past two weeks, have you felt down, depressed or hopeless? No  Q2: Over the past two  weeks, have you felt little interest or pleasure in doing things? No  Have you lost interest or pleasure in daily life? No  Do you often feel hopeless? No  Do you cry easily over simple problems? No  Activities of Daily Living In your present state of health, do you have any difficulty performing the following activities?:  Driving? No Managing money?  No Feeding yourself? No Getting from bed to chair? No Climbing a flight of stairs? No Preparing food and eating?: No Bathing or showering? No Getting dressed: No Getting to the toilet? No Using the toilet:No Moving around from place to place: No In the past year have you fallen or had a near fall?:No   Are you sexually active?  No  Do you have more than one partner?  No  Vision  Difficulties: No  Hearing Difficulties: No Do you often ask people to speak up or repeat themselves? No Do you experience ringing or noises in your ears? No Do you have difficulty understanding soft or whispered voices? No  Cognition  Do you feel that you have a problem with memory?No  Do you often misplace items? No  Do you feel safe at home?  Yes  Advanced directives Does patient have a [place]? Yes Does patient have a Living Will? Yes  In addition to the HPI above,  No Fever-chills,  No Headache, No changes with Vision or hearing,  No problems swallowing food or Liquids,  No Chest pain or productive Cough or Shortness of Breath,  No Abdominal pain, No Nausea or Vomitting, Bowel movements are regular,  No Blood in stool or Urine,  No dysuria,  No new skin rashes or bruises,  No new joints pains-aches,  No new weakness, tingling, numbness in any extremity,  No recent weight loss,  No polyuria, polydypsia or polyphagia,  No significant Mental Stressors.  A full 10 point Review of Systems was done, except as stated above, all other Review of Systems were negative  Objective:     BP 138/84   Pulse 52  Temp 98.1 F   Resp 16  Ht 5\' 9"    Wt 165 lb 9.6 oz         BMI 24.44  General appearance: alert, no distress, WD/WN, male Cognitive Testing  Alert? Yes  Normal Appearance? Yes  Oriented to person? Yes  Place? Yes   Time? Yes  Recall of three objects?  Yes  Can perform simple calculations? Yes  Displays appropriate judgment? Yes  Can read the correct time from a watch/clock? Yes  HEENT: normocephalic, sclerae anicteric, TMs pearly, nares patent, no discharge or erythema, pharynx normal Oral cavity: MMM, no lesions Neck: supple, no lymphadenopathy, no thyromegaly, no masses Heart: RRR, normal S1, S2, no murmurs Lungs: CTA bilaterally, no wheezes, rhonchi, or rales Abdomen: +bs, soft, non tender, non distended, no masses, no hepatomegaly, no  splenomegaly Musculoskeletal: nontender, no swelling, no obvious deformity Extremities: no edema, no cyanosis, no clubbing Pulses: 2+ symmetric, upper and lower extremities, normal cap refill Neurological: alert, oriented x 3, CN2-12 intact, strength normal upper extremities and lower extremities, sensation normal throughout, DTRs 2+ throughout, no cerebellar signs, gait normal Psychiatric: normal affect, behavior normal, pleasant   Medicare Attestation I have personally reviewed: The patient's medical and social history Their use of alcohol, tobacco or illicit drugs Their current medications and supplements The patient's functional ability including ADLs,fall risks, home safety risks, cognitive, and hearing and visual impairment Diet and  physical activities Evidence for depression or mood disorders  The patient's weight, height, BMI, and visual acuity have been recorded in the chart.  I have made referrals, counseling, and provided education to the patient based on review of the above and I have provided the patient with a written personalized care plan for preventive services.    Shakea Isip DAVID, MD   05/04/2014

## 2014-05-04 NOTE — Patient Instructions (Signed)

## 2014-05-05 LAB — LIPID PANEL
CHOL/HDL RATIO: 2.7 ratio
Cholesterol: 185 mg/dL (ref 0–200)
HDL: 69 mg/dL (ref >39)
LDL CALC: 98 mg/dL (ref 0–99)
Triglycerides: 89 mg/dL (ref <150)
VLDL: 18 mg/dL (ref 0–40)

## 2014-05-05 LAB — BASIC METABOLIC PANEL WITH GFR
BUN: 21 mg/dL (ref 6–23)
CO2: 29 mEq/L (ref 19–32)
Calcium: 9.9 mg/dL (ref 8.4–10.5)
Chloride: 104 mEq/L (ref 96–112)
Creat: 0.9 mg/dL (ref 0.50–1.35)
GFR, EST NON AFRICAN AMERICAN: 74 mL/min
GFR, Est African American: 86 mL/min
Glucose, Bld: 80 mg/dL (ref 70–99)
POTASSIUM: 4.1 meq/L (ref 3.5–5.3)
Sodium: 140 mEq/L (ref 135–145)

## 2014-05-05 LAB — HEPATIC FUNCTION PANEL
ALK PHOS: 83 U/L (ref 39–117)
ALT: 13 U/L (ref 0–53)
AST: 19 U/L (ref 0–37)
Albumin: 4.2 g/dL (ref 3.5–5.2)
BILIRUBIN INDIRECT: 0.6 mg/dL (ref 0.2–1.2)
Bilirubin, Direct: 0.2 mg/dL (ref 0.0–0.3)
Total Bilirubin: 0.8 mg/dL (ref 0.2–1.2)
Total Protein: 6.8 g/dL (ref 6.0–8.3)

## 2014-05-05 LAB — HEMOGLOBIN A1C
Hgb A1c MFr Bld: 5 % (ref <5.7)
MEAN PLASMA GLUCOSE: 97 mg/dL (ref <117)

## 2014-05-05 LAB — VITAMIN D 25 HYDROXY (VIT D DEFICIENCY, FRACTURES): Vit D, 25-Hydroxy: 66 ng/mL (ref 30–100)

## 2014-05-05 LAB — MAGNESIUM: MAGNESIUM: 2 mg/dL (ref 1.5–2.5)

## 2014-05-05 LAB — INSULIN, FASTING: INSULIN FASTING, SERUM: 4.8 u[IU]/mL (ref 2.0–19.6)

## 2014-05-05 LAB — TSH: TSH: 4.976 u[IU]/mL — ABNORMAL HIGH (ref 0.350–4.500)

## 2014-05-06 DIAGNOSIS — H4010X0 Unspecified open-angle glaucoma, stage unspecified: Secondary | ICD-10-CM | POA: Diagnosis not present

## 2014-05-06 DIAGNOSIS — H3532 Exudative age-related macular degeneration: Secondary | ICD-10-CM | POA: Diagnosis not present

## 2014-05-06 DIAGNOSIS — Z961 Presence of intraocular lens: Secondary | ICD-10-CM | POA: Diagnosis not present

## 2014-05-06 DIAGNOSIS — H31012 Macula scars of posterior pole (postinflammatory) (post-traumatic), left eye: Secondary | ICD-10-CM | POA: Diagnosis not present

## 2014-06-04 ENCOUNTER — Ambulatory Visit (INDEPENDENT_AMBULATORY_CARE_PROVIDER_SITE_OTHER): Payer: Medicare Other | Admitting: *Deleted

## 2014-06-04 DIAGNOSIS — E039 Hypothyroidism, unspecified: Secondary | ICD-10-CM | POA: Diagnosis not present

## 2014-06-04 LAB — TSH: TSH: 3.494 u[IU]/mL (ref 0.350–4.500)

## 2014-06-04 NOTE — Progress Notes (Signed)
Patient ID: Martin Massey, male   DOB: 03-Nov-1920, 81 y.o.   MRN: [id number] Patient presents for recheck TSH level.  Patient states he is currently taking 1.5 pills Levothyroxine QD.

## 2014-07-18 ENCOUNTER — Encounter: Payer: Self-pay | Admitting: *Deleted

## 2014-07-22 DIAGNOSIS — H4010X0 Unspecified open-angle glaucoma, stage unspecified: Secondary | ICD-10-CM | POA: Diagnosis not present

## 2014-07-22 DIAGNOSIS — H31012 Macula scars of posterior pole (postinflammatory) (post-traumatic), left eye: Secondary | ICD-10-CM | POA: Diagnosis not present

## 2014-07-22 DIAGNOSIS — Z961 Presence of intraocular lens: Secondary | ICD-10-CM | POA: Diagnosis not present

## 2014-07-22 DIAGNOSIS — H3532 Exudative age-related macular degeneration: Secondary | ICD-10-CM | POA: Diagnosis not present

## 2014-07-27 DIAGNOSIS — Z961 Presence of intraocular lens: Secondary | ICD-10-CM | POA: Diagnosis not present

## 2014-07-27 DIAGNOSIS — H3532 Exudative age-related macular degeneration: Secondary | ICD-10-CM | POA: Diagnosis not present

## 2014-08-10 ENCOUNTER — Ambulatory Visit (INDEPENDENT_AMBULATORY_CARE_PROVIDER_SITE_OTHER): Payer: Medicare Other | Admitting: Internal Medicine

## 2014-08-10 ENCOUNTER — Encounter: Payer: Self-pay | Admitting: Internal Medicine

## 2014-08-10 ENCOUNTER — Ambulatory Visit: Payer: Self-pay | Admitting: Physician Assistant

## 2014-08-10 VITALS — BP 146/80 | HR 56 | Temp 97.7°F | Resp 16 | Ht 69.0 in | Wt 172.0 lb

## 2014-08-10 DIAGNOSIS — Z79899 Other long term (current) drug therapy: Secondary | ICD-10-CM | POA: Diagnosis not present

## 2014-08-10 DIAGNOSIS — R7309 Other abnormal glucose: Secondary | ICD-10-CM | POA: Diagnosis not present

## 2014-08-10 DIAGNOSIS — M159 Polyosteoarthritis, unspecified: Secondary | ICD-10-CM

## 2014-08-10 DIAGNOSIS — E559 Vitamin D deficiency, unspecified: Secondary | ICD-10-CM

## 2014-08-10 DIAGNOSIS — R7303 Prediabetes: Secondary | ICD-10-CM

## 2014-08-10 DIAGNOSIS — E039 Hypothyroidism, unspecified: Secondary | ICD-10-CM | POA: Diagnosis not present

## 2014-08-10 DIAGNOSIS — E782 Mixed hyperlipidemia: Secondary | ICD-10-CM | POA: Diagnosis not present

## 2014-08-10 DIAGNOSIS — I10 Essential (primary) hypertension: Secondary | ICD-10-CM | POA: Diagnosis not present

## 2014-08-10 LAB — CBC WITH DIFFERENTIAL/PLATELET
Basophils Absolute: 0.1 10*3/uL (ref 0.0–0.1)
Basophils Relative: 1 % (ref 0–1)
EOS ABS: 0.2 10*3/uL (ref 0.0–0.7)
Eosinophils Relative: 3 % (ref 0–5)
HEMATOCRIT: 40.6 % (ref 39.0–52.0)
HEMOGLOBIN: 13.7 g/dL (ref 13.0–17.0)
LYMPHS PCT: 33 % (ref 12–46)
Lymphs Abs: 1.8 10*3/uL (ref 0.7–4.0)
MCH: 30.4 pg (ref 26.0–34.0)
MCHC: 33.7 g/dL (ref 30.0–36.0)
MCV: 90 fL (ref 78.0–100.0)
MONOS PCT: 9 % (ref 3–12)
MPV: 10 fL (ref 8.6–12.4)
Monocytes Absolute: 0.5 10*3/uL (ref 0.1–1.0)
NEUTROS ABS: 2.9 10*3/uL (ref 1.7–7.7)
Neutrophils Relative %: 54 % (ref 43–77)
Platelets: 178 10*3/uL (ref 150–400)
RBC: 4.51 MIL/uL (ref 4.22–5.81)
RDW: 13.7 % (ref 11.5–15.5)
WBC: 5.4 10*3/uL (ref 4.0–10.5)

## 2014-08-10 LAB — HEMOGLOBIN A1C
HEMOGLOBIN A1C: 5.2 % (ref <5.7)
MEAN PLASMA GLUCOSE: 103 mg/dL (ref <117)

## 2014-08-10 NOTE — Patient Instructions (Signed)
Vitamin D Deficiency Vitamin D is an important vitamin that your body needs. Having too little of it in your body is called a deficiency. A very bad deficiency can make your bones soft and can cause a condition called rickets.  Vitamin D is important to your body for different reasons, such as:   It helps your body absorb 2 minerals called calcium and phosphorus.  It helps make your bones healthy.  It may prevent some diseases, such as diabetes and multiple sclerosis.  It helps your muscles and heart. You can get vitamin D in several ways. It is a natural part of some foods. The vitamin is also added to some dairy products and cereals. Some people take vitamin D supplements. Also, your body makes vitamin D when you are in the sun. It changes the sun's rays into a form of the vitamin that your body can use. CAUSES   Not eating enough foods that contain vitamin D.  Not getting enough sunlight.  Having certain digestive system diseases that make it hard to absorb vitamin D. These diseases include Crohn's disease, chronic pancreatitis, and cystic fibrosis.  Having a surgery in which part of the stomach or small intestine is removed.  Being obese. Fat cells pull vitamin D out of your blood. That means that obese people may not have enough vitamin D left in their blood and in other body tissues.  Having chronic kidney or liver disease. RISK FACTORS Risk factors are things that make you more likely to develop a vitamin D deficiency. They include:  Being older.  Not being able to get outside very much.  Living in a nursing home.  Having had broken bones.  Having weak or thin bones (osteoporosis).  Having a disease or condition that changes how your body absorbs vitamin D.  Having dark skin.  Some medicines such as seizure medicines or steroids.  Being overweight or obese. SYMPTOMS Mild cases of vitamin D deficiency may not have any symptoms. If you have a very bad case, symptoms  may include:  Bone pain.  Muscle pain.  Falling often.  Broken bones caused by a minor injury, due to osteoporosis. DIAGNOSIS A blood test is the best way to tell if you have a vitamin D deficiency. TREATMENT Vitamin D deficiency can be treated in different ways. Treatment for vitamin D deficiency depends on what is causing it. Options include:  Taking vitamin D supplements.  Taking a calcium supplement. Your caregiver will suggest what dose is best for you. HOME CARE INSTRUCTIONS  Take any supplements that your caregiver prescribes. Follow the directions carefully. Take only the suggested amount.  Have your blood tested 2 months after you start taking supplements.  Eat foods that contain vitamin D. Healthy choices include:  Fortified dairy products, cereals, or juices. Fortified means vitamin D has been added to the food. Check the label on the package to be sure.  Fatty fish like salmon or trout.  Eggs.  Oysters.  Do not use a tanning bed.  Keep your weight at a healthy level. Lose weight if you need to.  Keep all follow-up appointments. Your caregiver will need to perform blood tests to make sure your vitamin D deficiency is going away. SEEK MEDICAL CARE IF:  You have any questions about your treatment.  You continue to have symptoms of vitamin D deficiency.  You have nausea or vomiting.  You are constipated.  You feel confused.  You have severe abdominal or back pain. MAKE   SURE YOU:  Understand these instructions.  Will watch your condition.  Will get help right away if you are not doing well or get worse. Document Released: 07/09/2011 Document Revised: 08/11/2012 Document Reviewed: 07/09/2011 [employer] Patient Information 2015 [employer]. This information is not intended to replace advice given to you by your health care provider. Make sure you discuss any questions you have with your health care provider.  

## 2014-08-10 NOTE — Progress Notes (Signed)
Patient ID: Martin Massey, male   DOB: 09/21/1920, 81 y.o.   MRN: [id number]  Assessment and Plan:   1. Essential hypertension -cont meds -DASH diet -cont to monitor at home - TSH  2. Prediabetes  - Hemoglobin A1c - Insulin, fasting  3. Hypothyroidism, unspecified hypothyroidism type -Cont levothyroxine 4. DJD -ibuprofen prn  5. Hyperlipidemia -cont diet and exercise - Lipid panel  6. Vitamin D deficiency -cont supplement - Vit D  25 hydroxy (rtn osteoporosis monitoring)  7. Medication management -d/c multivitamin - CBC with Differential/Platelet - BASIC METABOLIC PANEL WITH GFR - Hepatic function panel - Magnesium  Continue diet and meds as discussed. Further disposition pending results of labs.  HPI 81 y.o. male  presents for 3 month follow up with hypertension, hyperlipidemia, prediabetes and vitamin D.   His blood pressure has been controlled at home, today their BP is BP: (!) 146/80 mmHg.   He does not workout. He denies chest pain, shortness of breath, dizziness.   He is on cholesterol medication and denies myalgias. His cholesterol is at goal. The cholesterol last visit was:   Lab Results  Component Value Date   CHOL 185 05/04/2014   HDL 69 05/04/2014   LDLCALC 98 05/04/2014   TRIG 89 05/04/2014   CHOLHDL 2.7 05/04/2014     He has been working on diet and exercise for prediabetes, and denies foot ulcerations, nausea, vomiting and weight loss. Last A1C in the office was:  Lab Results  Component Value Date   HGBA1C 5.0 05/04/2014    Patient is on Vitamin D supplement.  Lab Results  Component Value Date   VD25OH 66 05/04/2014      Current Medications:  Current Outpatient Prescriptions on File Prior to Visit  Medication Sig Dispense Refill  . aspirin 81 MG tablet Take 81 mg by mouth daily.    Marland Kitchen atorvastatin (LIPITOR) 20 MG tablet Take 20 mg by mouth daily.    . brimonidine-timolol (COMBIGAN) 0.2-0.5 % ophthalmic solution Place 1 drop into both  eyes every 12 (twelve) hours.    . Cholecalciferol (VITAMIN D) 2000 UNITS CAPS Take by mouth. 2 in the morning and 1 at night    . dorzolamide (TRUSOPT) 2 % ophthalmic solution     . enalapril (VASOTEC) 20 MG tablet TAKE 1 TABLET DAILY FOR    BLOOD PRESSURE 90 tablet 99  . finasteride (PROSCAR) 5 MG tablet Take 1 tablet (5 mg total) by mouth daily. 90 tablet 2  . hydrochlorothiazide (HYDRODIURIL) 25 MG tablet TAKE 1 TABLET DAILY FOR    BLOOD PRESSURE AND FLUID 90 tablet 1  . ibuprofen (ADVIL,MOTRIN) 200 MG tablet Take 200 mg by mouth every 6 (six) hours as needed.    . latanoprost (XALATAN) 0.005 % ophthalmic solution     . levothyroxine (SYNTHROID, LEVOTHROID) 50 MCG tablet Take 1 tablet (50 mcg total) by mouth daily before breakfast. Take 1-2 tabs before breakfast daily or as directed 180 tablet 99  . Multiple Vitamin (MULTIVITAMIN WITH MINERALS) TABS tablet Take 1 tablet by mouth daily.    Marland Kitchen NIFEdipine (PROCARDIA-XL/ADALAT CC) 30 MG 24 hr tablet Take 1 tablet (30 mg total) by mouth daily. 90 tablet 1  . OVER THE COUNTER MEDICATION 2 capsules 2 (two) times daily. preservision    . ranitidine (ZANTAC) 150 MG capsule Take 150 mg by mouth as needed for heartburn.     No current facility-administered medications on file prior to visit.    Medical History:  Past Medical History  Diagnosis Date  . CAD (coronary artery disease)     CABG x3  . Hypertension   . Hyperlipidemia   . History of stress test 11/30/2011    was nonischemic and unchanged from prior study  . MI (myocardial infarction) 07/1989    Non-Q wave   . H/O angioplasty 07/1989    with PTCA of 2 vessels  . Colon cancer 02/19/01  . Kidney stones   . Hypothyroidism   . GERD (gastroesophageal reflux disease)   . History of echocardiogram 12/06    EF 50%-55% which was normal with mild to moderate MR and a cardiolite which showed inferior scar versus diaphragmatic attenuation   . History of stress test 06.2011    showed diastolic  continuation without ischemia.    Allergies:  Allergies  Allergen Reactions  . Lopressor [Metoprolol] Other (See Comments)    bradycardia     Review of Systems:  Review of Systems  Constitutional: Negative for fever, chills and weight loss.  HENT: Positive for congestion. Negative for ear discharge, ear pain and sore throat.   Eyes: Positive for blurred vision.  Respiratory: Negative for cough, shortness of breath and wheezing.   Cardiovascular: Negative for chest pain, palpitations and leg swelling.  Gastrointestinal: Positive for constipation. Negative for heartburn, nausea, vomiting, diarrhea, blood in stool and melena.  Genitourinary: Negative for dysuria, urgency and frequency.  Musculoskeletal: Positive for joint pain.  Skin: Negative.   Neurological: Positive for weakness. Negative for dizziness, sensory change and headaches.  Psychiatric/Behavioral: Negative for depression. The patient is not nervous/anxious and does not have insomnia.     Family history- Review and unchanged  Social history- Review and unchanged  Physical Exam: BP 146/80 mmHg  Pulse 56  Temp(Src) 97.7 F (36.5 C)  Resp 16  Ht 5\' 9"  (1.753 m)  Wt 172 lb (78.019 kg)  BMI 25.39 kg/m2 Wt Readings from Last 3 Encounters:  08/10/14 172 lb (78.019 kg)  05/04/14 165 lb 9.6 oz (75.116 kg)  01/18/14 169 lb (76.658 kg)    General Appearance:Elderly, Well nourished well developed, in no apparent distress. Eyes: PERRLA, EOMs, conjunctiva no swelling or erythema, some cloudyness to lens bilaterally.   ENT/Mouth: Ear canals normal without obstruction, swelling, erythma, discharge.  TMs normal bilaterally.  Oropharynx moist, clear, without exudate, or postoropharyngeal swelling. Neck: Supple, thyroid normal,no cervical adenopathy  Respiratory: Respiratory effort normal, Breath sounds clear A&P without rhonchi, wheeze, or rale.  No retractions, no accessory usage. Cardio: RRR with no MRGs. Brisk peripheral  pulses without edema.  Abdomen: Soft, + BS,  Non tender, no guarding, rebound, hernias, masses. Musculoskeletal: Full ROM, 5/5 strength, Shuffling gait Skin: Warm, dry without rashes, lesions, ecchymosis.  Neuro: Awake and oriented X 3, Cranial nerves intact. Normal muscle tone, no cerebellar symptoms. Psych: Normal affect, Insight and Judgment appropriate.    FORCUCCI, Berman Grainger, PA-C 10:47 AM [facility]

## 2014-08-11 LAB — LIPID PANEL
Cholesterol: 160 mg/dL (ref 0–200)
HDL: 53 mg/dL (ref >=40)
LDL Cholesterol: 88 mg/dL (ref 0–99)
Total CHOL/HDL Ratio: 3 Ratio
Triglycerides: 96 mg/dL (ref <150)
VLDL: 19 mg/dL (ref 0–40)

## 2014-08-11 LAB — BASIC METABOLIC PANEL WITH GFR
BUN: 23 mg/dL (ref 6–23)
CHLORIDE: 105 meq/L (ref 96–112)
CO2: 26 mEq/L (ref 19–32)
CREATININE: 0.94 mg/dL (ref 0.50–1.35)
Calcium: 9.4 mg/dL (ref 8.4–10.5)
GFR, EST NON AFRICAN AMERICAN: 70 mL/min
GFR, Est African American: 81 mL/min
Glucose, Bld: 88 mg/dL (ref 70–99)
POTASSIUM: 4.1 meq/L (ref 3.5–5.3)
SODIUM: 140 meq/L (ref 135–145)

## 2014-08-11 LAB — HEPATIC FUNCTION PANEL
ALBUMIN: 3.8 g/dL (ref 3.5–5.2)
ALT: 13 U/L (ref 0–53)
AST: 18 U/L (ref 0–37)
Alkaline Phosphatase: 71 U/L (ref 39–117)
BILIRUBIN DIRECT: 0.2 mg/dL (ref 0.0–0.3)
BILIRUBIN TOTAL: 0.7 mg/dL (ref 0.2–1.2)
Indirect Bilirubin: 0.5 mg/dL (ref 0.2–1.2)
Total Protein: 6 g/dL (ref 6.0–8.3)

## 2014-08-11 LAB — TSH: TSH: 2.469 u[IU]/mL (ref 0.350–4.500)

## 2014-08-11 LAB — INSULIN, FASTING: INSULIN FASTING, SERUM: 8.4 u[IU]/mL (ref 2.0–19.6)

## 2014-08-11 LAB — VITAMIN D 25 HYDROXY (VIT D DEFICIENCY, FRACTURES): Vit D, 25-Hydroxy: 65 ng/mL (ref 30–100)

## 2014-08-11 LAB — MAGNESIUM: Magnesium: 2 mg/dL (ref 1.5–2.5)

## 2014-09-02 DIAGNOSIS — D485 Neoplasm of uncertain behavior of skin: Secondary | ICD-10-CM | POA: Diagnosis not present

## 2014-09-02 DIAGNOSIS — C44319 Basal cell carcinoma of skin of other parts of face: Secondary | ICD-10-CM | POA: Diagnosis not present

## 2014-09-02 DIAGNOSIS — Z85828 Personal history of other malignant neoplasm of skin: Secondary | ICD-10-CM | POA: Diagnosis not present

## 2014-09-02 DIAGNOSIS — L570 Actinic keratosis: Secondary | ICD-10-CM | POA: Diagnosis not present

## 2014-09-02 DIAGNOSIS — L821 Other seborrheic keratosis: Secondary | ICD-10-CM | POA: Diagnosis not present

## 2014-09-09 DIAGNOSIS — H4010X0 Unspecified open-angle glaucoma, stage unspecified: Secondary | ICD-10-CM | POA: Diagnosis not present

## 2014-09-09 DIAGNOSIS — H31012 Macula scars of posterior pole (postinflammatory) (post-traumatic), left eye: Secondary | ICD-10-CM | POA: Diagnosis not present

## 2014-09-09 DIAGNOSIS — H3532 Exudative age-related macular degeneration: Secondary | ICD-10-CM | POA: Diagnosis not present

## 2014-09-09 DIAGNOSIS — Z961 Presence of intraocular lens: Secondary | ICD-10-CM | POA: Diagnosis not present

## 2014-09-21 ENCOUNTER — Other Ambulatory Visit: Payer: Self-pay

## 2014-09-21 MED ORDER — NIFEDIPINE ER 30 MG PO TB24
30.0000 mg | ORAL_TABLET | Freq: Every day | ORAL | Status: AC
Start: 2014-09-21 — End: ?

## 2014-09-22 ENCOUNTER — Other Ambulatory Visit: Payer: Self-pay | Admitting: *Deleted

## 2014-10-04 ENCOUNTER — Encounter: Payer: Self-pay | Admitting: Internal Medicine

## 2014-10-07 DIAGNOSIS — C44319 Basal cell carcinoma of skin of other parts of face: Secondary | ICD-10-CM | POA: Diagnosis not present

## 2014-10-14 ENCOUNTER — Ambulatory Visit: Payer: Self-pay | Admitting: Internal Medicine

## 2014-10-16 ENCOUNTER — Other Ambulatory Visit: Payer: Self-pay | Admitting: Emergency Medicine

## 2014-10-19 ENCOUNTER — Other Ambulatory Visit: Payer: Self-pay | Admitting: *Deleted

## 2014-10-19 MED ORDER — HYDROCHLOROTHIAZIDE 25 MG PO TABS
ORAL_TABLET | ORAL | Status: AC
Start: 2014-10-19 — End: ?

## 2014-11-09 ENCOUNTER — Ambulatory Visit: Payer: Self-pay | Admitting: Internal Medicine

## 2014-11-15 ENCOUNTER — Encounter: Payer: Self-pay | Admitting: Internal Medicine

## 2014-11-15 ENCOUNTER — Ambulatory Visit (INDEPENDENT_AMBULATORY_CARE_PROVIDER_SITE_OTHER): Payer: Medicare Other | Admitting: Internal Medicine

## 2014-11-15 VITALS — BP 142/78 | HR 60 | Temp 97.5°F | Resp 16 | Ht 68.0 in | Wt 163.6 lb

## 2014-11-15 DIAGNOSIS — I10 Essential (primary) hypertension: Secondary | ICD-10-CM

## 2014-11-15 DIAGNOSIS — R7309 Other abnormal glucose: Secondary | ICD-10-CM

## 2014-11-15 DIAGNOSIS — Z1212 Encounter for screening for malignant neoplasm of rectum: Secondary | ICD-10-CM

## 2014-11-15 DIAGNOSIS — Z23 Encounter for immunization: Secondary | ICD-10-CM

## 2014-11-15 DIAGNOSIS — Z125 Encounter for screening for malignant neoplasm of prostate: Secondary | ICD-10-CM

## 2014-11-15 DIAGNOSIS — Z0000 Encounter for general adult medical examination without abnormal findings: Secondary | ICD-10-CM | POA: Insufficient documentation

## 2014-11-15 DIAGNOSIS — Z6824 Body mass index (BMI) 24.0-24.9, adult: Secondary | ICD-10-CM

## 2014-11-15 DIAGNOSIS — Z1331 Encounter for screening for depression: Secondary | ICD-10-CM

## 2014-11-15 DIAGNOSIS — E782 Mixed hyperlipidemia: Secondary | ICD-10-CM

## 2014-11-15 DIAGNOSIS — Z9181 History of falling: Secondary | ICD-10-CM

## 2014-11-15 DIAGNOSIS — Z79899 Other long term (current) drug therapy: Secondary | ICD-10-CM

## 2014-11-15 DIAGNOSIS — E559 Vitamin D deficiency, unspecified: Secondary | ICD-10-CM

## 2014-11-15 DIAGNOSIS — R972 Elevated prostate specific antigen [PSA]: Secondary | ICD-10-CM | POA: Diagnosis not present

## 2014-11-15 DIAGNOSIS — R7303 Prediabetes: Secondary | ICD-10-CM

## 2014-11-15 DIAGNOSIS — E039 Hypothyroidism, unspecified: Secondary | ICD-10-CM

## 2014-11-15 LAB — HEPATIC FUNCTION PANEL
ALT: 15 U/L (ref 0–53)
AST: 19 U/L (ref 0–37)
Albumin: 3.7 g/dL (ref 3.5–5.2)
Alkaline Phosphatase: 67 U/L (ref 39–117)
Bilirubin, Direct: 0.2 mg/dL (ref 0.0–0.3)
Indirect Bilirubin: 0.8 mg/dL (ref 0.2–1.2)
Total Bilirubin: 1 mg/dL (ref 0.2–1.2)
Total Protein: 6.2 g/dL (ref 6.0–8.3)

## 2014-11-15 LAB — BASIC METABOLIC PANEL WITH GFR
BUN: 19 mg/dL (ref 6–23)
CALCIUM: 9.5 mg/dL (ref 8.4–10.5)
CO2: 25 mEq/L (ref 19–32)
Chloride: 101 mEq/L (ref 96–112)
Creat: 0.9 mg/dL (ref 0.50–1.35)
GFR, EST NON AFRICAN AMERICAN: 74 mL/min
GFR, Est African American: 85 mL/min
Glucose, Bld: 101 mg/dL — ABNORMAL HIGH (ref 70–99)
Potassium: 3.9 mEq/L (ref 3.5–5.3)
Sodium: 136 mEq/L (ref 135–145)

## 2014-11-15 LAB — CBC WITH DIFFERENTIAL/PLATELET
BASOS ABS: 0.1 10*3/uL (ref 0.0–0.1)
Basophils Relative: 1 % (ref 0–1)
EOS PCT: 3 % (ref 0–5)
Eosinophils Absolute: 0.2 10*3/uL (ref 0.0–0.7)
HEMATOCRIT: 40.3 % (ref 39.0–52.0)
Hemoglobin: 13.6 g/dL (ref 13.0–17.0)
LYMPHS PCT: 35 % (ref 12–46)
Lymphs Abs: 2 10*3/uL (ref 0.7–4.0)
MCH: 30.6 pg (ref 26.0–34.0)
MCHC: 33.7 g/dL (ref 30.0–36.0)
MCV: 90.8 fL (ref 78.0–100.0)
MONO ABS: 0.5 10*3/uL (ref 0.1–1.0)
MPV: 9.4 fL (ref 8.6–12.4)
Monocytes Relative: 9 % (ref 3–12)
NEUTROS ABS: 3 10*3/uL (ref 1.7–7.7)
NEUTROS PCT: 52 % (ref 43–77)
PLATELETS: 191 10*3/uL (ref 150–400)
RBC: 4.44 MIL/uL (ref 4.22–5.81)
RDW: 14 % (ref 11.5–15.5)
WBC: 5.7 10*3/uL (ref 4.0–10.5)

## 2014-11-15 LAB — LIPID PANEL
CHOL/HDL RATIO: 2.8 ratio
CHOLESTEROL: 163 mg/dL (ref 0–200)
HDL: 58 mg/dL (ref >=40)
LDL Cholesterol: 89 mg/dL (ref 0–99)
TRIGLYCERIDES: 80 mg/dL (ref <150)
VLDL: 16 mg/dL (ref 0–40)

## 2014-11-15 LAB — TSH: TSH: 3.91 u[IU]/mL (ref 0.350–4.500)

## 2014-11-15 LAB — HEMOGLOBIN A1C
HEMOGLOBIN A1C: 5.3 % (ref <5.7)
Mean Plasma Glucose: 105 mg/dL (ref <117)

## 2014-11-15 LAB — MAGNESIUM: MAGNESIUM: 1.9 mg/dL (ref 1.5–2.5)

## 2014-11-15 NOTE — Progress Notes (Signed)
Patient ID: Martin Massey, male   DOB: 09/09/1920, 81 y.o.   MRN: [id number]  Annual Comprehensive Examination  This very nice 81 y.o. MWM presents for complete physical.  Patient has been followed for HTN, ASCAD, Prediabetes screening, Hyperlipidemia, and Vitamin D Deficiency.   HTN predates circa 1970's. Patient's BP has been controlled at home.Today's BP: (!) 142/78 mmHg. Patient had an acute SEMI in 1993 and underwent PTCA and did well until he represent with ACS in 2006 and underwent a CABG. In 2011 he had a negative Myoview.  Patient denies any cardiac symptoms as chest pain, palpitations, shortness of breath, dizziness or ankle swelling.   Patient's hyperlipidemia is controlled with diet and medications. Patient denies myalgias or other medication SE's. Last lipids were at goal -  Cholesterol 160; HDL 53; LDL Cholesterol 88; Triglycerides 96 on 08/10/2014.   Patient is monitored expectantly for prediabetes and patient denies reactive hypoglycemic symptoms,  diabetic polys or paresthesias. He does have "Wet" Macular Degeneration with decreased vision to finger counting and has forgone driving.  Last A1c was 5.2% on 08/10/2014.      Finally, patient has history of Vitamin D Deficiency of 34 in 2008 and last vitamin D was 08/10/2014: Vit D, 25-Hydroxy 65 on       Medication Sig  . aspirin 81 MG tablet Take 81 mg by mouth daily.  Marland Kitchen atorvastatin  20 MG tablet Take 20 mg by mouth daily. Patient takes every other day.  . brimonidine-timolol (COMBIGAN)  ophth soln Place 1 drop into both eyes every 12 (twelve) hours.  Marland Kitchen VITAMIN D 2000 UNITS Take by mouth. 2 in the morning and 1 at night  . dorzolamide (TRUSOPT) 2 % ophth soln Place 1 drop into the left eye 2 (two) times daily.   . Enalapril 20 MG tablet TAKE 1 TABLET DAILY FOR    BLOOD PRESSURE  . finasteride  5 MG tablet Take 1 tablet (5 mg total) by mouth daily.  . hctz25 MG tablet TAKE 1 TABLET DAILY FOR    BLOOD PRESSURE AND FLUID  . ibuprofen   200 MG tablet Take 200 mg by mouth every 6 (six) hours as needed.  . latanoprost (XALATAN) 0.005 % opht soln Place 1 drop into the left eye at bedtime.   Marland Kitchen levothyroxine  50 MCG tablet Take 1-2 tabs before breakfast daily or as directed  . NIFEdipine -XL 30 MG 24 hr Take 1 tablet (30 mg total) by mouth daily.  . preservision 2 capsules 2 (two) times daily.    Allergies  Allergen Reactions  . Lopressor [Metoprolol] Other (See Comments)    bradycardia   Past Medical History  Diagnosis Date  . CAD (coronary artery disease)     CABG x3  . Hypertension   . Hyperlipidemia   . History of stress test 11/30/2011    was nonischemic and unchanged from prior study  . MI (myocardial infarction) 07/1989    Non-Q wave   . H/O angioplasty 07/1989    with PTCA of 2 vessels  . Colon cancer 02/19/01  . Kidney stones   . Hypothyroidism   . GERD (gastroesophageal reflux disease)   . History of echocardiogram 12/06    EF 50%-55% which was normal with mild to moderate MR and a cardiolite which showed inferior scar versus diaphragmatic attenuation   . History of stress test 06.2011    showed diastolic continuation without ischemia.   Health Maintenance  Topic Date Due  .  TETANUS/TDAP  05/19/1941  . PNA vac Low Risk Adult (2 of 2 - PCV13) 09/19/2013  . INFLUENZA VACCINE  11/29/2014  . COLONOSCOPY  07/30/2020  . ZOSTAVAX  Completed   Immunization History  Administered Date(s) Administered  . DTaP 08/14/2011  . Influenza Whole 01/20/2013  . Pneumococcal Conjugate-13 11/15/2014  . Pneumococcal Polysaccharide-23 09/19/2012  . Zoster 08/28/2005   Past Surgical History  Procedure Laterality Date  . Coronary artery bypass graft       x3 by Dr Tharon Aquas Trigt with LIMA to his LAD, a vein to the circumflex and PDA..  . Cardiac catheterization  11/1991    found to have circumflex re-stenosis which re-angioplastied.  . Colon surgery  02/19/01    at [facility] for colon cancer  . Knee surgery  Left 2002    By Dr Maxie Better  . Rotator cuff repair    . Cataract surgery  2002  . Cardiac catheterization  12/20/2004    revealing a 90% calcified proximal LAD lesion, 50% to 60% mid-circumflex lesion and high-grade distal tandem RCA lesions with normal LV function.   Family History  Problem Relation Age of Onset  . Heart attack Mother    History   Social History  . Marital Status: Married    Spouse Name: N/A  . Number of Children: 2 sons & 1 daughter who is a retired Stage manager. & 5 [place]  . Years of Education: N/A   Occupational History  . Retired Chief Financial Officer in [place]  . Smoking status: Former Smoker    Quit date: 04/30/1964  . Smokeless tobacco: Not on file  . Alcohol Use: 1.0 - 1.5 oz/week    2-3 drink(s) per week  . Drug Use: No  . Sexual Activity: No    ROS Constitutional: Denies fever, chills, weight loss/gain, headaches, insomnia,  night sweats or change in appetite. Does c/o fatigue. Eyes: Denies redness, diplopia, discharge, itchy or watery eyes. Reports decreased vision to finger counting and is receiving Shots for "wet" Macular Degeneration by Dr Renford Dills.  ENT: Denies discharge, congestion, post nasal drip, epistaxis, sore throat, earache, hearing loss, dental pain, Tinnitus, Vertigo, Sinus pain or snoring.  Cardio: Denies chest pain, palpitations, irregular heartbeat, syncope, dyspnea, diaphoresis, orthopnea, PND, claudication or edema Respiratory: denies cough, dyspnea, DOE, pleurisy, hoarseness, laryngitis or wheezing.  Gastrointestinal: Denies dysphagia, heartburn, reflux, water brash, pain, cramps, nausea, vomiting, bloating, diarrhea, constipation, hematemesis, melena, hematochezia, jaundice or hemorrhoids Genitourinary: Denies dysuria, frequency, urgency, nocturia, hesitancy, discharge, hematuria or flank pain Musculoskeletal: Denies arthralgia, myalgia, stiffness, Jt. Swelling, pain, limp or strain/sprain. Denies Falls. Skin: Denies  puritis, rash, hives, warts, acne, eczema or change in skin lesion Neuro: No weakness, tremor, incoordination, spasms, paresthesia or pain Psychiatric: Denies confusion, memory loss or sensory loss. Denies Depression. Endocrine: Denies change in weight, skin, hair change, nocturia, and paresthesia, diabetic polys, visual blurring or hyper / hypo glycemic episodes.  Heme/Lymph: No excessive bleeding, bruising or enlarged lymph nodes.  Physical Exam  BP 142/78   Pulse 60  Temp 97.5 F   Resp 16  Ht 5\' 8"    Wt 163 lb 9.6 oz     BMI 24.88   General Appearance: Well nourished, in no apparent distress. Eyes: PERRLA, EOMs, conjunctiva no swelling or erythema. Fundi not well visualized. Sinuses: No frontal/maxillary tenderness. ENT/Mouth: EACs patent / TMs  nl. Nares clear without erythema, swelling, mucoid exudates. Oral hygiene is good. No erythema, swelling, or exudate. Tongue normal,  non-obstructing. Tonsils not swollen or erythematous. Hearing normal.  Neck: Supple, thyroid normal. No bruits, nodes or JVD. Respiratory: Respiratory effort normal.  BS equal and clear bilateral without rales, rhonci, wheezing or stridor. Cardio: Heart sounds are normal with regular rate and rhythm and no murmurs, rubs or gallops. Peripheral pulses are normal and equal bilaterally without edema. No aortic or femoral bruits. Chest: Median sternotomy scar. symmetric with normal excursions and percussion.  Abdomen: Flat, soft, with bowel sounds. Nontender, no guarding, rebound, hernias, masses, or organomegaly.  Lymphatics: Non tender without lymphadenopathy.  Genitourinary:  Deferred for age. Musculoskeletal: Full ROM all peripheral extremities, joint stability, 5/5 strength, and normal gait. Skin: Warm and dry without rashes, lesions, cyanosis, clubbing or  ecchymosis.  Neuro: Cranial nerves intact, reflexes equal bilaterally. Normal muscle tone, no cerebellar symptoms. Sensation intact.  Pysch: Awake and  oriented X 3 with normal affect, insight and judgment appropriate.   Assessment and Plan  1. Essential hypertension  - Microalbumin / creatinine urine ratio - EKG 12-Lead - [place], RETROPERITNL ABD,  LTD - TSH  2. Hyperlipidemia  - Lipid panel  3. Prediabetes  - Hemoglobin A1c - Insulin, random  4. Vitamin D deficiency  - Vit D  25 hydroxy   5. Hypothyroidism   6. Screening for rectal cancer  - POC Hemoccult Bld/Stl (3-Cd Home Screen); Future  7. Prostate cancer screening   8. At low risk for fall   9. Depression screen   10. Elevated PSA  - PSA  11. Need for prophylactic vaccination against Streptococcus pneumoniae (pneumococcus)  - Pneumococcal conjugate vaccine 13-valent  12. Medication management  - Urine Microscopic - CBC with Differential/Platelet - BASIC METABOLIC PANEL WITH GFR - Hepatic function panel - Magnesium  13. Body mass index (BMI) of 24.0-24.9 in adult  14. ASHD s/p CABG    Continue prudent diet as discussed, weight control, BP monitoring, regular exercise, and medications as discussed.  Discussed med effects and SE's. Routine screening labs and tests as requested with regular follow-up as recommended.  Over 40 minutes of exam, counseling &  chart review was performed

## 2014-11-15 NOTE — Patient Instructions (Signed)

## 2014-11-16 LAB — PSA: PSA: 0.19 ng/mL (ref <=4.00)

## 2014-11-16 LAB — MICROALBUMIN / CREATININE URINE RATIO
CREATININE, URINE: 62.1 mg/dL
MICROALB/CREAT RATIO: 4.8 mg/g (ref 0.0–30.0)
Microalb, Ur: 0.3 mg/dL (ref <2.0)

## 2014-11-16 LAB — VITAMIN D 25 HYDROXY (VIT D DEFICIENCY, FRACTURES): Vit D, 25-Hydroxy: 71 ng/mL (ref 30–100)

## 2014-11-16 LAB — INSULIN, RANDOM: Insulin: 7.8 u[IU]/mL (ref 2.0–19.6)

## 2014-11-16 LAB — URINALYSIS, MICROSCOPIC ONLY
BACTERIA UA: NONE SEEN
CRYSTALS: NONE SEEN
Casts: NONE SEEN
Squamous Epithelial / LPF: NONE SEEN

## 2014-11-24 ENCOUNTER — Other Ambulatory Visit: Payer: Self-pay | Admitting: *Deleted

## 2014-11-24 MED ORDER — IBUPROFEN 800 MG PO TABS: 800.0000 mg | ORAL_TABLET | Freq: Three times a day (TID) | ORAL | Status: AC

## 2014-12-09 DIAGNOSIS — H3532 Exudative age-related macular degeneration: Secondary | ICD-10-CM | POA: Diagnosis not present

## 2014-12-09 DIAGNOSIS — H4011X3 Primary open-angle glaucoma, severe stage: Secondary | ICD-10-CM | POA: Diagnosis not present

## 2014-12-09 DIAGNOSIS — H31012 Macula scars of posterior pole (postinflammatory) (post-traumatic), left eye: Secondary | ICD-10-CM | POA: Diagnosis not present

## 2014-12-09 DIAGNOSIS — Z961 Presence of intraocular lens: Secondary | ICD-10-CM | POA: Diagnosis not present

## 2014-12-13 ENCOUNTER — Other Ambulatory Visit: Payer: Self-pay | Admitting: *Deleted

## 2014-12-13 MED ORDER — LEVOTHYROXINE SODIUM 50 MCG PO TABS: 50.0000 ug | ORAL_TABLET | Freq: Every day | ORAL | Status: AC

## 2015-01-10 ENCOUNTER — Other Ambulatory Visit: Payer: Self-pay

## 2015-01-10 MED ORDER — FINASTERIDE 5 MG PO TABS: 5.0000 mg | ORAL_TABLET | Freq: Every day | ORAL | Status: AC

## 2015-01-30 DIAGNOSIS — Z23 Encounter for immunization: Secondary | ICD-10-CM | POA: Diagnosis not present

## 2015-02-02 ENCOUNTER — Other Ambulatory Visit: Payer: Self-pay | Admitting: *Deleted

## 2015-02-02 DIAGNOSIS — Z1212 Encounter for screening for malignant neoplasm of rectum: Secondary | ICD-10-CM

## 2015-02-02 LAB — POC HEMOCCULT BLD/STL (HOME/3-CARD/SCREEN)
Card #2 Fecal Occult Blod, POC: NEGATIVE
Card #3 Fecal Occult Blood, POC: NEGATIVE
Fecal Occult Blood, POC: NEGATIVE

## 2015-02-15 ENCOUNTER — Ambulatory Visit (INDEPENDENT_AMBULATORY_CARE_PROVIDER_SITE_OTHER): Payer: Medicare Other | Admitting: Internal Medicine

## 2015-02-15 ENCOUNTER — Encounter: Payer: Self-pay | Admitting: Internal Medicine

## 2015-02-15 VITALS — BP 136/80 | HR 52 | Temp 98.2°F | Resp 16 | Ht 69.0 in | Wt 152.0 lb

## 2015-02-15 DIAGNOSIS — Z79899 Other long term (current) drug therapy: Secondary | ICD-10-CM | POA: Diagnosis not present

## 2015-02-15 DIAGNOSIS — I10 Essential (primary) hypertension: Secondary | ICD-10-CM | POA: Diagnosis not present

## 2015-02-15 DIAGNOSIS — E559 Vitamin D deficiency, unspecified: Secondary | ICD-10-CM

## 2015-02-15 DIAGNOSIS — E039 Hypothyroidism, unspecified: Secondary | ICD-10-CM | POA: Diagnosis not present

## 2015-02-15 DIAGNOSIS — E782 Mixed hyperlipidemia: Secondary | ICD-10-CM

## 2015-02-15 DIAGNOSIS — R7303 Prediabetes: Secondary | ICD-10-CM | POA: Diagnosis not present

## 2015-02-15 LAB — CBC WITH DIFFERENTIAL/PLATELET
Basophils Absolute: 0.1 10*3/uL (ref 0.0–0.1)
Basophils Relative: 1 % (ref 0–1)
Eosinophils Absolute: 0.2 10*3/uL (ref 0.0–0.7)
Eosinophils Relative: 3 % (ref 0–5)
HEMATOCRIT: 36.8 % — AB (ref 39.0–52.0)
Hemoglobin: 12.5 g/dL — ABNORMAL LOW (ref 13.0–17.0)
LYMPHS PCT: 39 % (ref 12–46)
Lymphs Abs: 2 10*3/uL (ref 0.7–4.0)
MCH: 31.2 pg (ref 26.0–34.0)
MCHC: 34 g/dL (ref 30.0–36.0)
MCV: 91.8 fL (ref 78.0–100.0)
MONO ABS: 0.5 10*3/uL (ref 0.1–1.0)
MPV: 9.7 fL (ref 8.6–12.4)
Monocytes Relative: 9 % (ref 3–12)
NEUTROS ABS: 2.4 10*3/uL (ref 1.7–7.7)
NEUTROS PCT: 48 % (ref 43–77)
Platelets: 177 10*3/uL (ref 150–400)
RBC: 4.01 MIL/uL — AB (ref 4.22–5.81)
RDW: 14.2 % (ref 11.5–15.5)
WBC: 5 10*3/uL (ref 4.0–10.5)

## 2015-02-15 LAB — BASIC METABOLIC PANEL WITH GFR
BUN: 25 mg/dL (ref 7–25)
CO2: 29 mmol/L (ref 20–31)
Calcium: 10 mg/dL (ref 8.6–10.3)
Chloride: 105 mmol/L (ref 98–110)
Creat: 1 mg/dL (ref 0.70–1.11)
GFR, EST NON AFRICAN AMERICAN: 65 mL/min (ref >=60)
GFR, Est African American: 75 mL/min (ref >=60)
Glucose, Bld: 97 mg/dL (ref 65–99)
Potassium: 3.7 mmol/L (ref 3.5–5.3)
Sodium: 143 mmol/L (ref 135–146)

## 2015-02-15 LAB — HEPATIC FUNCTION PANEL
ALK PHOS: 74 U/L (ref 40–115)
ALT: 17 U/L (ref 9–46)
AST: 24 U/L (ref 10–35)
Albumin: 3.9 g/dL (ref 3.6–5.1)
BILIRUBIN INDIRECT: 0.7 mg/dL (ref 0.2–1.2)
Bilirubin, Direct: 0.3 mg/dL — ABNORMAL HIGH (ref <=0.2)
TOTAL PROTEIN: 6.4 g/dL (ref 6.1–8.1)
Total Bilirubin: 1 mg/dL (ref 0.2–1.2)

## 2015-02-15 LAB — TSH: TSH: 2.33 u[IU]/mL (ref 0.350–4.500)

## 2015-02-15 NOTE — Patient Instructions (Signed)
Please stop taking atorvastatin or lipitor for your cholesterol.  Please go to [facility] and get some more Vitamin D or it is also called cholecalciferol.   They pharmacist can help you if you cant find it.  Please start drinking either boost or ensure in between meals as you have lost a tremendous amount of weight over the last six months.

## 2015-02-15 NOTE — Progress Notes (Signed)
Patient ID: Martin Massey, male   DOB: Nov 13, 1920, 81 y.o.   MRN: [id number]  Assessment and Plan:  Hypertension:  -Continue medication,  -monitor blood pressure at home.  -Continue DASH diet.   -Reminder to go to the ER if any CP, SOB, nausea, dizziness, severe HA, changes vision/speech, left arm numbness and tingling, and jaw pain.  Cholesterol: -stop lipitor as 92 and well controlled -Continue diet and exercise.   Pre-diabetes: -Continue diet and exercise.  -Check A1C  Vitamin D Def: -check level -continue medications.   Weight loss -concerned that this is mostly secondary to poor appetite.   -recommend increasing calorie intake and also given boost.    Continue diet and meds as discussed. Further disposition pending results of labs.  HPI 81 y.o. male  presents for 3 month follow up with hypertension, hyperlipidemia, prediabetes and vitamin D.   His blood pressure has been controlled at home, today their BP is BP: 136/80 mmHg.   He does not workout. He denies chest pain, shortness of breath, dizziness.  He reports that he is no exercising because he has a hard time getting around and recently he has been caring for his wife as she just feel.     He is on cholesterol medication and denies myalgias. His cholesterol is at goal. The cholesterol last visit was:   Lab Results  Component Value Date   CHOL 163 11/15/2014   HDL 58 11/15/2014   LDLCALC 89 11/15/2014   TRIG 80 11/15/2014   CHOLHDL 2.8 11/15/2014     He has been working on diet and exercise for prediabetes, and denies foot ulcerations, hyperglycemia, hypoglycemia , increased appetite, nausea, paresthesia of the feet, polydipsia, polyuria, visual disturbances, vomiting and weight loss. Last A1C in the office was:  Lab Results  Component Value Date   HGBA1C 5.3 11/15/2014    Patient is on Vitamin D supplement.  Lab Results  Component Value Date   VD25OH 54 11/15/2014      Current Medications:  Current  Outpatient Prescriptions on File Prior to Visit  Medication Sig Dispense Refill  . aspirin 81 MG tablet Take 81 mg by mouth daily.    Marland Kitchen atorvastatin (LIPITOR) 20 MG tablet Take 20 mg by mouth daily. Patient takes every other day.    . brimonidine-timolol (COMBIGAN) 0.2-0.5 % ophthalmic solution Place 1 drop into both eyes every 12 (twelve) hours.    . Cholecalciferol (VITAMIN D) 2000 UNITS CAPS Take by mouth. 2 in the morning and 1 at night    . dorzolamide (TRUSOPT) 2 % ophthalmic solution Place 1 drop into the left eye 2 (two) times daily.     . enalapril (VASOTEC) 20 MG tablet TAKE 1 TABLET DAILY FOR    BLOOD PRESSURE 90 tablet 99  . finasteride (PROSCAR) 5 MG tablet Take 1 tablet (5 mg total) by mouth daily. 90 tablet 2  . hydrochlorothiazide (HYDRODIURIL) 25 MG tablet TAKE 1 TABLET DAILY FOR    BLOOD PRESSURE AND FLUID 90 tablet 1  . ibuprofen (ADVIL,MOTRIN) 800 MG tablet Take 1 tablet (800 mg total) by mouth 3 (three) times daily. Take after meals for pain and inflammation. 270 tablet 4  . latanoprost (XALATAN) 0.005 % ophthalmic solution Place 1 drop into the left eye at bedtime.     Marland Kitchen levothyroxine (SYNTHROID, LEVOTHROID) 50 MCG tablet Take 1 tablet (50 mcg total) by mouth daily before breakfast. Take 1-2 tabs before breakfast daily or as directed 180 tablet 4  .  NIFEdipine (PROCARDIA-XL/ADALAT CC) 30 MG 24 hr tablet Take 1 tablet (30 mg total) by mouth daily. 90 tablet 3  . OVER THE COUNTER MEDICATION 2 capsules 2 (two) times daily. preservision     No current facility-administered medications on file prior to visit.    Medical History:  Past Medical History  Diagnosis Date  . CAD (coronary artery disease)     CABG x3  . Hypertension   . Hyperlipidemia   . History of stress test 11/30/2011    was nonischemic and unchanged from prior study  . MI (myocardial infarction) ([place]) 07/1989    Non-Q wave   . H/O angioplasty 07/1989    with PTCA of 2 vessels  . Colon cancer ([place])  02/19/01  . Kidney stones   . Hypothyroidism   . GERD (gastroesophageal reflux disease)   . History of echocardiogram 12/06    EF 50%-55% which was normal with mild to moderate MR and a cardiolite which showed inferior scar versus diaphragmatic attenuation   . History of stress test 06.2011    showed diastolic continuation without ischemia.    Allergies:  Allergies  Allergen Reactions  . Lopressor [Metoprolol] Other (See Comments)    bradycardia     Review of Systems:  Review of Systems  Constitutional: Positive for weight loss. Negative for fever, chills and malaise/fatigue.  HENT: Negative for congestion, ear pain and sore throat.   Eyes: Positive for blurred vision.  Respiratory: Negative for cough, shortness of breath and wheezing.   Cardiovascular: Negative for chest pain, palpitations, claudication and leg swelling.  Gastrointestinal: Negative for heartburn, diarrhea, constipation, blood in stool and melena.  Genitourinary: Negative.   Skin: Negative.   Neurological: Negative for dizziness, sensory change, loss of consciousness and headaches.  Psychiatric/Behavioral: Negative for depression. The patient is not nervous/anxious and does not have insomnia.     Family history- Review and unchanged  Social history- Review and unchanged  Physical Exam: BP 136/80 mmHg  Pulse 52  Temp(Src) 98.2 F (36.8 C) (Temporal)  Resp 16  Ht 5\' 9"  (1.753 m)  Wt 152 lb (68.947 kg)  BMI 22.44 kg/m2 Wt Readings from Last 3 Encounters:  02/15/15 152 lb (68.947 kg)  11/15/14 163 lb 9.6 oz (74.208 kg)  08/10/14 172 lb (78.019 kg)    General Appearance: Well nourished well developed, in no apparent distress. Eyes: PERRLA, EOMs, conjunctiva no swelling or erythema ENT/Mouth: Ear canals normal without obstruction, swelling, erythma, discharge.  TMs normal bilaterally.  Oropharynx moist, clear, without exudate, or postoropharyngeal swelling. Neck: Supple, thyroid normal,no cervical  adenopathy  Respiratory: Respiratory effort normal, Breath sounds clear A&P without rhonchi, wheeze, or rale.  No retractions, no accessory usage. Cardio: RRR with no MRGs. Brisk peripheral pulses without edema.  Abdomen: Soft, + BS,  Non tender, no guarding, rebound, hernias, masses. Musculoskeletal: Full ROM, 5/5 strength, Normal gait Skin: Warm, dry without rashes, lesions, ecchymosis.  Neuro: Awake and oriented X 3, Cranial nerves intact. Normal muscle tone, no cerebellar symptoms. Psych: Normal affect, Insight and Judgment appropriate.    Starlyn Skeans, PA-C 10:26 AM [facility]

## 2015-03-14 ENCOUNTER — Other Ambulatory Visit: Payer: Self-pay | Admitting: *Deleted

## 2015-03-14 MED ORDER — ENALAPRIL MALEATE 20 MG PO TABS: ORAL_TABLET | ORAL | Status: DC

## 2015-03-15 ENCOUNTER — Other Ambulatory Visit: Payer: Self-pay | Admitting: *Deleted

## 2015-03-15 MED ORDER — ENALAPRIL MALEATE 20 MG PO TABS: ORAL_TABLET | ORAL | Status: AC

## 2015-04-26 ENCOUNTER — Other Ambulatory Visit: Payer: Self-pay | Admitting: Internal Medicine

## 2015-05-11 DIAGNOSIS — H353222 Exudative age-related macular degeneration, left eye, with inactive choroidal neovascularization: Secondary | ICD-10-CM | POA: Diagnosis not present

## 2015-05-11 DIAGNOSIS — H353211 Exudative age-related macular degeneration, right eye, with active choroidal neovascularization: Secondary | ICD-10-CM | POA: Diagnosis not present

## 2015-05-13 DIAGNOSIS — H353211 Exudative age-related macular degeneration, right eye, with active choroidal neovascularization: Secondary | ICD-10-CM | POA: Diagnosis not present

## 2015-05-13 DIAGNOSIS — H353222 Exudative age-related macular degeneration, left eye, with inactive choroidal neovascularization: Secondary | ICD-10-CM | POA: Diagnosis not present

## 2015-05-24 ENCOUNTER — Ambulatory Visit: Payer: Self-pay | Admitting: Internal Medicine

## 2015-05-30 DIAGNOSIS — H9193 Unspecified hearing loss, bilateral: Secondary | ICD-10-CM | POA: Diagnosis not present

## 2015-05-30 DIAGNOSIS — N401 Enlarged prostate with lower urinary tract symptoms: Secondary | ICD-10-CM | POA: Diagnosis not present

## 2015-05-30 DIAGNOSIS — I10 Essential (primary) hypertension: Secondary | ICD-10-CM | POA: Diagnosis not present

## 2015-05-30 DIAGNOSIS — H3530 Unspecified macular degeneration: Secondary | ICD-10-CM | POA: Diagnosis not present

## 2015-06-14 DIAGNOSIS — H903 Sensorineural hearing loss, bilateral: Secondary | ICD-10-CM | POA: Diagnosis not present

## 2015-06-14 DIAGNOSIS — H6123 Impacted cerumen, bilateral: Secondary | ICD-10-CM | POA: Diagnosis not present

## 2015-06-24 DIAGNOSIS — H353211 Exudative age-related macular degeneration, right eye, with active choroidal neovascularization: Secondary | ICD-10-CM | POA: Diagnosis not present

## 2015-06-24 DIAGNOSIS — H353222 Exudative age-related macular degeneration, left eye, with inactive choroidal neovascularization: Secondary | ICD-10-CM | POA: Diagnosis not present

## 2015-07-25 DIAGNOSIS — Z08 Encounter for follow-up examination after completed treatment for malignant neoplasm: Secondary | ICD-10-CM | POA: Diagnosis not present

## 2015-07-25 DIAGNOSIS — L570 Actinic keratosis: Secondary | ICD-10-CM | POA: Diagnosis not present

## 2015-07-25 DIAGNOSIS — Z85828 Personal history of other malignant neoplasm of skin: Secondary | ICD-10-CM | POA: Diagnosis not present

## 2015-08-10 DIAGNOSIS — H353211 Exudative age-related macular degeneration, right eye, with active choroidal neovascularization: Secondary | ICD-10-CM | POA: Diagnosis not present

## 2015-08-10 DIAGNOSIS — H353222 Exudative age-related macular degeneration, left eye, with inactive choroidal neovascularization: Secondary | ICD-10-CM | POA: Diagnosis not present

## 2015-09-02 DIAGNOSIS — I2510 Atherosclerotic heart disease of native coronary artery without angina pectoris: Secondary | ICD-10-CM | POA: Diagnosis not present

## 2015-09-02 DIAGNOSIS — E039 Hypothyroidism, unspecified: Secondary | ICD-10-CM | POA: Diagnosis not present

## 2015-09-02 DIAGNOSIS — N401 Enlarged prostate with lower urinary tract symptoms: Secondary | ICD-10-CM | POA: Diagnosis not present

## 2015-09-28 DIAGNOSIS — H353222 Exudative age-related macular degeneration, left eye, with inactive choroidal neovascularization: Secondary | ICD-10-CM | POA: Diagnosis not present

## 2015-09-28 DIAGNOSIS — H353211 Exudative age-related macular degeneration, right eye, with active choroidal neovascularization: Secondary | ICD-10-CM | POA: Diagnosis not present

## 2015-11-23 DIAGNOSIS — H353211 Exudative age-related macular degeneration, right eye, with active choroidal neovascularization: Secondary | ICD-10-CM | POA: Diagnosis not present

## 2015-11-23 DIAGNOSIS — H353222 Exudative age-related macular degeneration, left eye, with inactive choroidal neovascularization: Secondary | ICD-10-CM | POA: Diagnosis not present

## 2015-12-06 ENCOUNTER — Encounter: Payer: Self-pay | Admitting: Internal Medicine

## 2016-01-19 DIAGNOSIS — L97323 Non-pressure chronic ulcer of left ankle with necrosis of muscle: Secondary | ICD-10-CM | POA: Diagnosis not present

## 2016-01-19 DIAGNOSIS — I10 Essential (primary) hypertension: Secondary | ICD-10-CM | POA: Diagnosis not present

## 2016-01-19 DIAGNOSIS — L84 Corns and callosities: Secondary | ICD-10-CM | POA: Diagnosis not present

## 2016-01-19 DIAGNOSIS — R609 Edema, unspecified: Secondary | ICD-10-CM | POA: Diagnosis not present

## 2016-01-19 DIAGNOSIS — Z23 Encounter for immunization: Secondary | ICD-10-CM | POA: Diagnosis not present

## 2016-01-19 DIAGNOSIS — R413 Other amnesia: Secondary | ICD-10-CM | POA: Diagnosis not present

## 2016-01-19 DIAGNOSIS — M79672 Pain in left foot: Secondary | ICD-10-CM | POA: Diagnosis not present

## 2016-01-19 DIAGNOSIS — G6289 Other specified polyneuropathies: Secondary | ICD-10-CM | POA: Diagnosis not present

## 2016-01-25 DIAGNOSIS — G6289 Other specified polyneuropathies: Secondary | ICD-10-CM | POA: Diagnosis not present

## 2016-01-25 DIAGNOSIS — L84 Corns and callosities: Secondary | ICD-10-CM | POA: Diagnosis not present

## 2016-01-25 DIAGNOSIS — M150 Primary generalized (osteo)arthritis: Secondary | ICD-10-CM | POA: Diagnosis not present

## 2016-01-25 DIAGNOSIS — B353 Tinea pedis: Secondary | ICD-10-CM | POA: Diagnosis not present

## 2016-02-01 DIAGNOSIS — H353211 Exudative age-related macular degeneration, right eye, with active choroidal neovascularization: Secondary | ICD-10-CM | POA: Diagnosis not present

## 2016-02-01 DIAGNOSIS — H353222 Exudative age-related macular degeneration, left eye, with inactive choroidal neovascularization: Secondary | ICD-10-CM | POA: Diagnosis not present

## 2016-03-05 ENCOUNTER — Other Ambulatory Visit: Payer: Self-pay | Admitting: Internal Medicine

## 2016-03-07 DIAGNOSIS — M150 Primary generalized (osteo)arthritis: Secondary | ICD-10-CM | POA: Diagnosis not present

## 2016-03-07 DIAGNOSIS — E039 Hypothyroidism, unspecified: Secondary | ICD-10-CM | POA: Diagnosis not present

## 2016-03-07 DIAGNOSIS — G6289 Other specified polyneuropathies: Secondary | ICD-10-CM | POA: Diagnosis not present

## 2016-03-07 DIAGNOSIS — I10 Essential (primary) hypertension: Secondary | ICD-10-CM | POA: Diagnosis not present

## 2016-03-08 DIAGNOSIS — D0439 Carcinoma in situ of skin of other parts of face: Secondary | ICD-10-CM | POA: Diagnosis not present

## 2016-03-08 DIAGNOSIS — D485 Neoplasm of uncertain behavior of skin: Secondary | ICD-10-CM | POA: Diagnosis not present

## 2016-03-08 DIAGNOSIS — C44219 Basal cell carcinoma of skin of left ear and external auricular canal: Secondary | ICD-10-CM | POA: Diagnosis not present

## 2016-03-09 DIAGNOSIS — C44219 Basal cell carcinoma of skin of left ear and external auricular canal: Secondary | ICD-10-CM | POA: Diagnosis not present

## 2016-03-09 DIAGNOSIS — D0439 Carcinoma in situ of skin of other parts of face: Secondary | ICD-10-CM | POA: Diagnosis not present

## 2016-03-21 DIAGNOSIS — M7742 Metatarsalgia, left foot: Secondary | ICD-10-CM | POA: Diagnosis not present

## 2016-03-21 DIAGNOSIS — L84 Corns and callosities: Secondary | ICD-10-CM | POA: Diagnosis not present

## 2016-04-06 DIAGNOSIS — H353223 Exudative age-related macular degeneration, left eye, with inactive scar: Secondary | ICD-10-CM | POA: Diagnosis not present

## 2016-04-06 DIAGNOSIS — H353211 Exudative age-related macular degeneration, right eye, with active choroidal neovascularization: Secondary | ICD-10-CM | POA: Diagnosis not present

## 2016-05-02 DIAGNOSIS — H3530 Unspecified macular degeneration: Secondary | ICD-10-CM | POA: Diagnosis not present

## 2016-05-02 DIAGNOSIS — G6289 Other specified polyneuropathies: Secondary | ICD-10-CM | POA: Diagnosis not present

## 2016-05-02 DIAGNOSIS — L84 Corns and callosities: Secondary | ICD-10-CM | POA: Diagnosis not present

## 2016-05-02 DIAGNOSIS — M150 Primary generalized (osteo)arthritis: Secondary | ICD-10-CM | POA: Diagnosis not present

## 2016-05-18 DIAGNOSIS — I10 Essential (primary) hypertension: Secondary | ICD-10-CM | POA: Diagnosis not present

## 2016-05-18 DIAGNOSIS — Z9181 History of falling: Secondary | ICD-10-CM | POA: Diagnosis not present

## 2016-05-18 DIAGNOSIS — R269 Unspecified abnormalities of gait and mobility: Secondary | ICD-10-CM | POA: Diagnosis not present

## 2016-05-18 DIAGNOSIS — I5043 Acute on chronic combined systolic (congestive) and diastolic (congestive) heart failure: Secondary | ICD-10-CM | POA: Diagnosis not present

## 2016-05-18 DIAGNOSIS — R0609 Other forms of dyspnea: Secondary | ICD-10-CM | POA: Diagnosis not present

## 2016-05-18 DIAGNOSIS — E039 Hypothyroidism, unspecified: Secondary | ICD-10-CM | POA: Diagnosis not present

## 2016-05-18 DIAGNOSIS — R221 Localized swelling, mass and lump, neck: Secondary | ICD-10-CM | POA: Diagnosis not present

## 2016-05-18 DIAGNOSIS — I2510 Atherosclerotic heart disease of native coronary artery without angina pectoris: Secondary | ICD-10-CM | POA: Diagnosis not present

## 2016-05-21 DIAGNOSIS — I2510 Atherosclerotic heart disease of native coronary artery without angina pectoris: Secondary | ICD-10-CM | POA: Diagnosis not present

## 2016-05-21 DIAGNOSIS — H3530 Unspecified macular degeneration: Secondary | ICD-10-CM | POA: Diagnosis not present

## 2016-05-21 DIAGNOSIS — I5043 Acute on chronic combined systolic (congestive) and diastolic (congestive) heart failure: Secondary | ICD-10-CM | POA: Diagnosis not present

## 2016-05-21 DIAGNOSIS — Z87891 Personal history of nicotine dependence: Secondary | ICD-10-CM | POA: Diagnosis not present

## 2016-05-21 DIAGNOSIS — R269 Unspecified abnormalities of gait and mobility: Secondary | ICD-10-CM | POA: Diagnosis not present

## 2016-05-21 DIAGNOSIS — M150 Primary generalized (osteo)arthritis: Secondary | ICD-10-CM | POA: Diagnosis not present

## 2016-05-21 DIAGNOSIS — I110 Hypertensive heart disease with heart failure: Secondary | ICD-10-CM | POA: Diagnosis not present

## 2016-05-21 DIAGNOSIS — Z9181 History of falling: Secondary | ICD-10-CM | POA: Diagnosis not present

## 2016-05-21 DIAGNOSIS — G629 Polyneuropathy, unspecified: Secondary | ICD-10-CM | POA: Diagnosis not present

## 2016-05-22 DIAGNOSIS — L821 Other seborrheic keratosis: Secondary | ICD-10-CM | POA: Diagnosis not present

## 2016-05-22 DIAGNOSIS — Z85828 Personal history of other malignant neoplasm of skin: Secondary | ICD-10-CM | POA: Diagnosis not present

## 2016-05-22 DIAGNOSIS — D485 Neoplasm of uncertain behavior of skin: Secondary | ICD-10-CM | POA: Diagnosis not present

## 2016-05-22 DIAGNOSIS — Z08 Encounter for follow-up examination after completed treatment for malignant neoplasm: Secondary | ICD-10-CM | POA: Diagnosis not present

## 2016-05-23 DIAGNOSIS — G629 Polyneuropathy, unspecified: Secondary | ICD-10-CM | POA: Diagnosis not present

## 2016-05-23 DIAGNOSIS — I110 Hypertensive heart disease with heart failure: Secondary | ICD-10-CM | POA: Diagnosis not present

## 2016-05-23 DIAGNOSIS — I2510 Atherosclerotic heart disease of native coronary artery without angina pectoris: Secondary | ICD-10-CM | POA: Diagnosis not present

## 2016-05-23 DIAGNOSIS — I5043 Acute on chronic combined systolic (congestive) and diastolic (congestive) heart failure: Secondary | ICD-10-CM | POA: Diagnosis not present

## 2016-05-23 DIAGNOSIS — R269 Unspecified abnormalities of gait and mobility: Secondary | ICD-10-CM | POA: Diagnosis not present

## 2016-05-23 DIAGNOSIS — M150 Primary generalized (osteo)arthritis: Secondary | ICD-10-CM | POA: Diagnosis not present

## 2016-05-24 DIAGNOSIS — I5043 Acute on chronic combined systolic (congestive) and diastolic (congestive) heart failure: Secondary | ICD-10-CM | POA: Diagnosis not present

## 2016-05-24 DIAGNOSIS — G629 Polyneuropathy, unspecified: Secondary | ICD-10-CM | POA: Diagnosis not present

## 2016-05-24 DIAGNOSIS — R269 Unspecified abnormalities of gait and mobility: Secondary | ICD-10-CM | POA: Diagnosis not present

## 2016-05-24 DIAGNOSIS — I2510 Atherosclerotic heart disease of native coronary artery without angina pectoris: Secondary | ICD-10-CM | POA: Diagnosis not present

## 2016-05-24 DIAGNOSIS — M150 Primary generalized (osteo)arthritis: Secondary | ICD-10-CM | POA: Diagnosis not present

## 2016-05-24 DIAGNOSIS — I110 Hypertensive heart disease with heart failure: Secondary | ICD-10-CM | POA: Diagnosis not present

## 2016-05-25 DIAGNOSIS — I5043 Acute on chronic combined systolic (congestive) and diastolic (congestive) heart failure: Secondary | ICD-10-CM | POA: Diagnosis not present

## 2016-05-25 DIAGNOSIS — R609 Edema, unspecified: Secondary | ICD-10-CM | POA: Diagnosis not present

## 2016-05-25 DIAGNOSIS — I10 Essential (primary) hypertension: Secondary | ICD-10-CM | POA: Diagnosis not present

## 2016-05-28 DIAGNOSIS — M150 Primary generalized (osteo)arthritis: Secondary | ICD-10-CM | POA: Diagnosis not present

## 2016-05-28 DIAGNOSIS — I110 Hypertensive heart disease with heart failure: Secondary | ICD-10-CM | POA: Diagnosis not present

## 2016-05-28 DIAGNOSIS — I2510 Atherosclerotic heart disease of native coronary artery without angina pectoris: Secondary | ICD-10-CM | POA: Diagnosis not present

## 2016-05-28 DIAGNOSIS — G629 Polyneuropathy, unspecified: Secondary | ICD-10-CM | POA: Diagnosis not present

## 2016-05-28 DIAGNOSIS — I5043 Acute on chronic combined systolic (congestive) and diastolic (congestive) heart failure: Secondary | ICD-10-CM | POA: Diagnosis not present

## 2016-05-28 DIAGNOSIS — R269 Unspecified abnormalities of gait and mobility: Secondary | ICD-10-CM | POA: Diagnosis not present

## 2016-05-30 DIAGNOSIS — R269 Unspecified abnormalities of gait and mobility: Secondary | ICD-10-CM | POA: Diagnosis not present

## 2016-05-30 DIAGNOSIS — I110 Hypertensive heart disease with heart failure: Secondary | ICD-10-CM | POA: Diagnosis not present

## 2016-05-30 DIAGNOSIS — I5043 Acute on chronic combined systolic (congestive) and diastolic (congestive) heart failure: Secondary | ICD-10-CM | POA: Diagnosis not present

## 2016-05-30 DIAGNOSIS — G629 Polyneuropathy, unspecified: Secondary | ICD-10-CM | POA: Diagnosis not present

## 2016-05-30 DIAGNOSIS — M150 Primary generalized (osteo)arthritis: Secondary | ICD-10-CM | POA: Diagnosis not present

## 2016-05-30 DIAGNOSIS — I2510 Atherosclerotic heart disease of native coronary artery without angina pectoris: Secondary | ICD-10-CM | POA: Diagnosis not present

## 2016-05-31 DIAGNOSIS — I5043 Acute on chronic combined systolic (congestive) and diastolic (congestive) heart failure: Secondary | ICD-10-CM | POA: Diagnosis not present

## 2016-05-31 DIAGNOSIS — R269 Unspecified abnormalities of gait and mobility: Secondary | ICD-10-CM | POA: Diagnosis not present

## 2016-05-31 DIAGNOSIS — M150 Primary generalized (osteo)arthritis: Secondary | ICD-10-CM | POA: Diagnosis not present

## 2016-05-31 DIAGNOSIS — I2510 Atherosclerotic heart disease of native coronary artery without angina pectoris: Secondary | ICD-10-CM | POA: Diagnosis not present

## 2016-05-31 DIAGNOSIS — G629 Polyneuropathy, unspecified: Secondary | ICD-10-CM | POA: Diagnosis not present

## 2016-05-31 DIAGNOSIS — I110 Hypertensive heart disease with heart failure: Secondary | ICD-10-CM | POA: Diagnosis not present

## 2016-06-04 DIAGNOSIS — M150 Primary generalized (osteo)arthritis: Secondary | ICD-10-CM | POA: Diagnosis not present

## 2016-06-04 DIAGNOSIS — G629 Polyneuropathy, unspecified: Secondary | ICD-10-CM | POA: Diagnosis not present

## 2016-06-04 DIAGNOSIS — R269 Unspecified abnormalities of gait and mobility: Secondary | ICD-10-CM | POA: Diagnosis not present

## 2016-06-04 DIAGNOSIS — I110 Hypertensive heart disease with heart failure: Secondary | ICD-10-CM | POA: Diagnosis not present

## 2016-06-04 DIAGNOSIS — I5043 Acute on chronic combined systolic (congestive) and diastolic (congestive) heart failure: Secondary | ICD-10-CM | POA: Diagnosis not present

## 2016-06-04 DIAGNOSIS — I2510 Atherosclerotic heart disease of native coronary artery without angina pectoris: Secondary | ICD-10-CM | POA: Diagnosis not present

## 2016-06-05 DIAGNOSIS — I5043 Acute on chronic combined systolic (congestive) and diastolic (congestive) heart failure: Secondary | ICD-10-CM | POA: Diagnosis not present

## 2016-06-05 DIAGNOSIS — R269 Unspecified abnormalities of gait and mobility: Secondary | ICD-10-CM | POA: Diagnosis not present

## 2016-06-05 DIAGNOSIS — I2510 Atherosclerotic heart disease of native coronary artery without angina pectoris: Secondary | ICD-10-CM | POA: Diagnosis not present

## 2016-06-05 DIAGNOSIS — G629 Polyneuropathy, unspecified: Secondary | ICD-10-CM | POA: Diagnosis not present

## 2016-06-05 DIAGNOSIS — I110 Hypertensive heart disease with heart failure: Secondary | ICD-10-CM | POA: Diagnosis not present

## 2016-06-05 DIAGNOSIS — M150 Primary generalized (osteo)arthritis: Secondary | ICD-10-CM | POA: Diagnosis not present

## 2016-06-07 DIAGNOSIS — M150 Primary generalized (osteo)arthritis: Secondary | ICD-10-CM | POA: Diagnosis not present

## 2016-06-07 DIAGNOSIS — G629 Polyneuropathy, unspecified: Secondary | ICD-10-CM | POA: Diagnosis not present

## 2016-06-07 DIAGNOSIS — I2510 Atherosclerotic heart disease of native coronary artery without angina pectoris: Secondary | ICD-10-CM | POA: Diagnosis not present

## 2016-06-07 DIAGNOSIS — I110 Hypertensive heart disease with heart failure: Secondary | ICD-10-CM | POA: Diagnosis not present

## 2016-06-07 DIAGNOSIS — I5043 Acute on chronic combined systolic (congestive) and diastolic (congestive) heart failure: Secondary | ICD-10-CM | POA: Diagnosis not present

## 2016-06-07 DIAGNOSIS — R269 Unspecified abnormalities of gait and mobility: Secondary | ICD-10-CM | POA: Diagnosis not present

## 2016-06-08 DIAGNOSIS — I110 Hypertensive heart disease with heart failure: Secondary | ICD-10-CM | POA: Diagnosis not present

## 2016-06-08 DIAGNOSIS — M150 Primary generalized (osteo)arthritis: Secondary | ICD-10-CM | POA: Diagnosis not present

## 2016-06-08 DIAGNOSIS — I2510 Atherosclerotic heart disease of native coronary artery without angina pectoris: Secondary | ICD-10-CM | POA: Diagnosis not present

## 2016-06-08 DIAGNOSIS — G629 Polyneuropathy, unspecified: Secondary | ICD-10-CM | POA: Diagnosis not present

## 2016-06-08 DIAGNOSIS — R269 Unspecified abnormalities of gait and mobility: Secondary | ICD-10-CM | POA: Diagnosis not present

## 2016-06-08 DIAGNOSIS — I5043 Acute on chronic combined systolic (congestive) and diastolic (congestive) heart failure: Secondary | ICD-10-CM | POA: Diagnosis not present

## 2016-06-11 DIAGNOSIS — L723 Sebaceous cyst: Secondary | ICD-10-CM | POA: Diagnosis not present

## 2016-06-12 DIAGNOSIS — I5043 Acute on chronic combined systolic (congestive) and diastolic (congestive) heart failure: Secondary | ICD-10-CM | POA: Diagnosis not present

## 2016-06-12 DIAGNOSIS — R269 Unspecified abnormalities of gait and mobility: Secondary | ICD-10-CM | POA: Diagnosis not present

## 2016-06-12 DIAGNOSIS — M150 Primary generalized (osteo)arthritis: Secondary | ICD-10-CM | POA: Diagnosis not present

## 2016-06-12 DIAGNOSIS — I110 Hypertensive heart disease with heart failure: Secondary | ICD-10-CM | POA: Diagnosis not present

## 2016-06-12 DIAGNOSIS — G629 Polyneuropathy, unspecified: Secondary | ICD-10-CM | POA: Diagnosis not present

## 2016-06-12 DIAGNOSIS — I2510 Atherosclerotic heart disease of native coronary artery without angina pectoris: Secondary | ICD-10-CM | POA: Diagnosis not present

## 2016-06-13 DIAGNOSIS — I2510 Atherosclerotic heart disease of native coronary artery without angina pectoris: Secondary | ICD-10-CM | POA: Diagnosis not present

## 2016-06-13 DIAGNOSIS — G629 Polyneuropathy, unspecified: Secondary | ICD-10-CM | POA: Diagnosis not present

## 2016-06-13 DIAGNOSIS — M150 Primary generalized (osteo)arthritis: Secondary | ICD-10-CM | POA: Diagnosis not present

## 2016-06-13 DIAGNOSIS — I5043 Acute on chronic combined systolic (congestive) and diastolic (congestive) heart failure: Secondary | ICD-10-CM | POA: Diagnosis not present

## 2016-06-13 DIAGNOSIS — R269 Unspecified abnormalities of gait and mobility: Secondary | ICD-10-CM | POA: Diagnosis not present

## 2016-06-13 DIAGNOSIS — I110 Hypertensive heart disease with heart failure: Secondary | ICD-10-CM | POA: Diagnosis not present

## 2016-06-15 DIAGNOSIS — G629 Polyneuropathy, unspecified: Secondary | ICD-10-CM | POA: Diagnosis not present

## 2016-06-15 DIAGNOSIS — M150 Primary generalized (osteo)arthritis: Secondary | ICD-10-CM | POA: Diagnosis not present

## 2016-06-15 DIAGNOSIS — R269 Unspecified abnormalities of gait and mobility: Secondary | ICD-10-CM | POA: Diagnosis not present

## 2016-06-15 DIAGNOSIS — I2510 Atherosclerotic heart disease of native coronary artery without angina pectoris: Secondary | ICD-10-CM | POA: Diagnosis not present

## 2016-06-18 DIAGNOSIS — H353211 Exudative age-related macular degeneration, right eye, with active choroidal neovascularization: Secondary | ICD-10-CM | POA: Diagnosis not present

## 2016-06-18 DIAGNOSIS — H353223 Exudative age-related macular degeneration, left eye, with inactive scar: Secondary | ICD-10-CM | POA: Diagnosis not present

## 2016-08-08 DIAGNOSIS — H3530 Unspecified macular degeneration: Secondary | ICD-10-CM | POA: Diagnosis not present

## 2016-08-08 DIAGNOSIS — R609 Edema, unspecified: Secondary | ICD-10-CM | POA: Diagnosis not present

## 2016-08-08 DIAGNOSIS — L603 Nail dystrophy: Secondary | ICD-10-CM | POA: Diagnosis not present

## 2016-08-08 DIAGNOSIS — G6289 Other specified polyneuropathies: Secondary | ICD-10-CM | POA: Diagnosis not present

## 2016-09-04 DIAGNOSIS — D538 Other specified nutritional anemias: Secondary | ICD-10-CM | POA: Diagnosis not present

## 2016-09-04 DIAGNOSIS — E039 Hypothyroidism, unspecified: Secondary | ICD-10-CM | POA: Diagnosis not present

## 2016-09-04 DIAGNOSIS — I5042 Chronic combined systolic (congestive) and diastolic (congestive) heart failure: Secondary | ICD-10-CM | POA: Diagnosis not present

## 2016-09-04 DIAGNOSIS — I10 Essential (primary) hypertension: Secondary | ICD-10-CM | POA: Diagnosis not present

## 2016-09-04 DIAGNOSIS — Z0000 Encounter for general adult medical examination without abnormal findings: Secondary | ICD-10-CM | POA: Diagnosis not present

## 2016-09-07 DIAGNOSIS — H353211 Exudative age-related macular degeneration, right eye, with active choroidal neovascularization: Secondary | ICD-10-CM | POA: Diagnosis not present

## 2016-09-07 DIAGNOSIS — H353223 Exudative age-related macular degeneration, left eye, with inactive scar: Secondary | ICD-10-CM | POA: Diagnosis not present

## 2016-11-07 DIAGNOSIS — H3530 Unspecified macular degeneration: Secondary | ICD-10-CM | POA: Diagnosis not present

## 2016-11-07 DIAGNOSIS — G6289 Other specified polyneuropathies: Secondary | ICD-10-CM | POA: Diagnosis not present

## 2016-11-07 DIAGNOSIS — M150 Primary generalized (osteo)arthritis: Secondary | ICD-10-CM | POA: Diagnosis not present

## 2016-11-07 DIAGNOSIS — B351 Tinea unguium: Secondary | ICD-10-CM | POA: Diagnosis not present

## 2016-11-19 DIAGNOSIS — L821 Other seborrheic keratosis: Secondary | ICD-10-CM | POA: Diagnosis not present

## 2016-11-19 DIAGNOSIS — S93402A Sprain of unspecified ligament of left ankle, initial encounter: Secondary | ICD-10-CM | POA: Diagnosis not present

## 2016-11-19 DIAGNOSIS — C44229 Squamous cell carcinoma of skin of left ear and external auricular canal: Secondary | ICD-10-CM | POA: Diagnosis not present

## 2016-11-19 DIAGNOSIS — W010XXA Fall on same level from slipping, tripping and stumbling without subsequent striking against object, initial encounter: Secondary | ICD-10-CM | POA: Diagnosis not present

## 2016-11-19 DIAGNOSIS — D485 Neoplasm of uncertain behavior of skin: Secondary | ICD-10-CM | POA: Diagnosis not present

## 2016-11-19 DIAGNOSIS — Z85828 Personal history of other malignant neoplasm of skin: Secondary | ICD-10-CM | POA: Diagnosis not present

## 2016-11-19 DIAGNOSIS — Z08 Encounter for follow-up examination after completed treatment for malignant neoplasm: Secondary | ICD-10-CM | POA: Diagnosis not present

## 2016-11-20 DIAGNOSIS — Z85828 Personal history of other malignant neoplasm of skin: Secondary | ICD-10-CM | POA: Diagnosis not present

## 2016-11-20 DIAGNOSIS — L821 Other seborrheic keratosis: Secondary | ICD-10-CM | POA: Diagnosis not present

## 2016-11-20 DIAGNOSIS — Z08 Encounter for follow-up examination after completed treatment for malignant neoplasm: Secondary | ICD-10-CM | POA: Diagnosis not present

## 2016-11-20 DIAGNOSIS — C44229 Squamous cell carcinoma of skin of left ear and external auricular canal: Secondary | ICD-10-CM | POA: Diagnosis not present

## 2016-11-26 DIAGNOSIS — M79605 Pain in left leg: Secondary | ICD-10-CM | POA: Diagnosis not present

## 2016-11-26 DIAGNOSIS — Z87891 Personal history of nicotine dependence: Secondary | ICD-10-CM | POA: Diagnosis not present

## 2016-11-26 DIAGNOSIS — I444 Left anterior fascicular block: Secondary | ICD-10-CM | POA: Diagnosis not present

## 2016-11-26 DIAGNOSIS — S3993XA Unspecified injury of pelvis, initial encounter: Secondary | ICD-10-CM | POA: Diagnosis not present

## 2016-11-26 DIAGNOSIS — N390 Urinary tract infection, site not specified: Secondary | ICD-10-CM | POA: Diagnosis not present

## 2016-11-26 DIAGNOSIS — I509 Heart failure, unspecified: Secondary | ICD-10-CM | POA: Diagnosis not present

## 2016-11-26 DIAGNOSIS — M25551 Pain in right hip: Secondary | ICD-10-CM | POA: Diagnosis not present

## 2016-11-26 DIAGNOSIS — F0390 Unspecified dementia without behavioral disturbance: Secondary | ICD-10-CM | POA: Diagnosis not present

## 2016-11-26 DIAGNOSIS — R350 Frequency of micturition: Secondary | ICD-10-CM | POA: Diagnosis not present

## 2016-11-26 DIAGNOSIS — I6782 Cerebral ischemia: Secondary | ICD-10-CM | POA: Diagnosis not present

## 2016-11-26 DIAGNOSIS — I110 Hypertensive heart disease with heart failure: Secondary | ICD-10-CM | POA: Diagnosis not present

## 2016-11-26 DIAGNOSIS — M25552 Pain in left hip: Secondary | ICD-10-CM | POA: Diagnosis not present

## 2016-11-26 DIAGNOSIS — Z951 Presence of aortocoronary bypass graft: Secondary | ICD-10-CM | POA: Diagnosis not present

## 2016-11-26 DIAGNOSIS — M79604 Pain in right leg: Secondary | ICD-10-CM | POA: Diagnosis not present

## 2016-11-26 DIAGNOSIS — M545 Low back pain: Secondary | ICD-10-CM | POA: Diagnosis not present

## 2016-11-26 DIAGNOSIS — I4891 Unspecified atrial fibrillation: Secondary | ICD-10-CM | POA: Diagnosis not present

## 2016-11-26 DIAGNOSIS — Y929 Unspecified place or not applicable: Secondary | ICD-10-CM | POA: Diagnosis not present

## 2016-11-26 DIAGNOSIS — Z85038 Personal history of other malignant neoplasm of large intestine: Secondary | ICD-10-CM | POA: Diagnosis not present

## 2016-11-26 DIAGNOSIS — S299XXA Unspecified injury of thorax, initial encounter: Secondary | ICD-10-CM | POA: Diagnosis not present

## 2016-11-26 DIAGNOSIS — W19XXXA Unspecified fall, initial encounter: Secondary | ICD-10-CM | POA: Diagnosis not present

## 2016-11-26 DIAGNOSIS — I252 Old myocardial infarction: Secondary | ICD-10-CM | POA: Diagnosis not present

## 2016-11-26 DIAGNOSIS — R531 Weakness: Secondary | ICD-10-CM | POA: Diagnosis not present

## 2016-11-26 DIAGNOSIS — R300 Dysuria: Secondary | ICD-10-CM | POA: Diagnosis not present

## 2016-11-26 DIAGNOSIS — S3992XA Unspecified injury of lower back, initial encounter: Secondary | ICD-10-CM | POA: Diagnosis not present

## 2016-11-26 DIAGNOSIS — R079 Chest pain, unspecified: Secondary | ICD-10-CM | POA: Diagnosis not present

## 2016-11-26 DIAGNOSIS — R0602 Shortness of breath: Secondary | ICD-10-CM | POA: Diagnosis not present

## 2016-11-28 DIAGNOSIS — H353211 Exudative age-related macular degeneration, right eye, with active choroidal neovascularization: Secondary | ICD-10-CM | POA: Diagnosis not present

## 2016-11-28 DIAGNOSIS — H353223 Exudative age-related macular degeneration, left eye, with inactive scar: Secondary | ICD-10-CM | POA: Diagnosis not present

## 2016-11-30 DIAGNOSIS — I5042 Chronic combined systolic (congestive) and diastolic (congestive) heart failure: Secondary | ICD-10-CM | POA: Diagnosis not present

## 2016-11-30 DIAGNOSIS — E559 Vitamin D deficiency, unspecified: Secondary | ICD-10-CM | POA: Diagnosis not present

## 2016-11-30 DIAGNOSIS — R269 Unspecified abnormalities of gait and mobility: Secondary | ICD-10-CM | POA: Diagnosis not present

## 2016-11-30 DIAGNOSIS — I10 Essential (primary) hypertension: Secondary | ICD-10-CM | POA: Diagnosis not present

## 2016-11-30 DIAGNOSIS — E039 Hypothyroidism, unspecified: Secondary | ICD-10-CM | POA: Diagnosis not present

## 2016-12-04 DIAGNOSIS — M6281 Muscle weakness (generalized): Secondary | ICD-10-CM | POA: Diagnosis not present

## 2016-12-04 DIAGNOSIS — R269 Unspecified abnormalities of gait and mobility: Secondary | ICD-10-CM | POA: Diagnosis not present

## 2016-12-04 DIAGNOSIS — G8911 Acute pain due to trauma: Secondary | ICD-10-CM | POA: Diagnosis not present

## 2016-12-10 DIAGNOSIS — G8911 Acute pain due to trauma: Secondary | ICD-10-CM | POA: Diagnosis not present

## 2016-12-10 DIAGNOSIS — M6281 Muscle weakness (generalized): Secondary | ICD-10-CM | POA: Diagnosis not present

## 2016-12-10 DIAGNOSIS — R269 Unspecified abnormalities of gait and mobility: Secondary | ICD-10-CM | POA: Diagnosis not present

## 2016-12-12 DIAGNOSIS — M6281 Muscle weakness (generalized): Secondary | ICD-10-CM | POA: Diagnosis not present

## 2016-12-12 DIAGNOSIS — G8911 Acute pain due to trauma: Secondary | ICD-10-CM | POA: Diagnosis not present

## 2016-12-12 DIAGNOSIS — R269 Unspecified abnormalities of gait and mobility: Secondary | ICD-10-CM | POA: Diagnosis not present

## 2016-12-14 DIAGNOSIS — G8911 Acute pain due to trauma: Secondary | ICD-10-CM | POA: Diagnosis not present

## 2016-12-14 DIAGNOSIS — R269 Unspecified abnormalities of gait and mobility: Secondary | ICD-10-CM | POA: Diagnosis not present

## 2016-12-14 DIAGNOSIS — M6281 Muscle weakness (generalized): Secondary | ICD-10-CM | POA: Diagnosis not present

## 2016-12-17 DIAGNOSIS — M6281 Muscle weakness (generalized): Secondary | ICD-10-CM | POA: Diagnosis not present

## 2016-12-17 DIAGNOSIS — G8911 Acute pain due to trauma: Secondary | ICD-10-CM | POA: Diagnosis not present

## 2016-12-17 DIAGNOSIS — R269 Unspecified abnormalities of gait and mobility: Secondary | ICD-10-CM | POA: Diagnosis not present

## 2016-12-19 DIAGNOSIS — G8911 Acute pain due to trauma: Secondary | ICD-10-CM | POA: Diagnosis not present

## 2016-12-19 DIAGNOSIS — R269 Unspecified abnormalities of gait and mobility: Secondary | ICD-10-CM | POA: Diagnosis not present

## 2016-12-19 DIAGNOSIS — M6281 Muscle weakness (generalized): Secondary | ICD-10-CM | POA: Diagnosis not present

## 2016-12-21 DIAGNOSIS — G8911 Acute pain due to trauma: Secondary | ICD-10-CM | POA: Diagnosis not present

## 2016-12-21 DIAGNOSIS — R269 Unspecified abnormalities of gait and mobility: Secondary | ICD-10-CM | POA: Diagnosis not present

## 2016-12-21 DIAGNOSIS — M6281 Muscle weakness (generalized): Secondary | ICD-10-CM | POA: Diagnosis not present

## 2016-12-24 DIAGNOSIS — M6281 Muscle weakness (generalized): Secondary | ICD-10-CM | POA: Diagnosis not present

## 2016-12-24 DIAGNOSIS — G8911 Acute pain due to trauma: Secondary | ICD-10-CM | POA: Diagnosis not present

## 2016-12-24 DIAGNOSIS — R269 Unspecified abnormalities of gait and mobility: Secondary | ICD-10-CM | POA: Diagnosis not present

## 2016-12-26 DIAGNOSIS — G8911 Acute pain due to trauma: Secondary | ICD-10-CM | POA: Diagnosis not present

## 2016-12-26 DIAGNOSIS — R269 Unspecified abnormalities of gait and mobility: Secondary | ICD-10-CM | POA: Diagnosis not present

## 2016-12-26 DIAGNOSIS — M6281 Muscle weakness (generalized): Secondary | ICD-10-CM | POA: Diagnosis not present

## 2016-12-28 DIAGNOSIS — G8911 Acute pain due to trauma: Secondary | ICD-10-CM | POA: Diagnosis not present

## 2016-12-28 DIAGNOSIS — R269 Unspecified abnormalities of gait and mobility: Secondary | ICD-10-CM | POA: Diagnosis not present

## 2016-12-28 DIAGNOSIS — M6281 Muscle weakness (generalized): Secondary | ICD-10-CM | POA: Diagnosis not present

## 2016-12-31 DIAGNOSIS — G8911 Acute pain due to trauma: Secondary | ICD-10-CM | POA: Diagnosis not present

## 2016-12-31 DIAGNOSIS — R269 Unspecified abnormalities of gait and mobility: Secondary | ICD-10-CM | POA: Diagnosis not present

## 2016-12-31 DIAGNOSIS — M6281 Muscle weakness (generalized): Secondary | ICD-10-CM | POA: Diagnosis not present

## 2017-01-02 DIAGNOSIS — M6281 Muscle weakness (generalized): Secondary | ICD-10-CM | POA: Diagnosis not present

## 2017-01-02 DIAGNOSIS — R269 Unspecified abnormalities of gait and mobility: Secondary | ICD-10-CM | POA: Diagnosis not present

## 2017-01-02 DIAGNOSIS — G8911 Acute pain due to trauma: Secondary | ICD-10-CM | POA: Diagnosis not present

## 2017-01-04 DIAGNOSIS — M6281 Muscle weakness (generalized): Secondary | ICD-10-CM | POA: Diagnosis not present

## 2017-01-04 DIAGNOSIS — G8911 Acute pain due to trauma: Secondary | ICD-10-CM | POA: Diagnosis not present

## 2017-01-04 DIAGNOSIS — R269 Unspecified abnormalities of gait and mobility: Secondary | ICD-10-CM | POA: Diagnosis not present

## 2017-01-07 ENCOUNTER — Encounter: Payer: Self-pay | Admitting: Internal Medicine

## 2017-01-07 DIAGNOSIS — M6281 Muscle weakness (generalized): Secondary | ICD-10-CM | POA: Diagnosis not present

## 2017-01-07 DIAGNOSIS — R269 Unspecified abnormalities of gait and mobility: Secondary | ICD-10-CM | POA: Diagnosis not present

## 2017-01-07 DIAGNOSIS — G8911 Acute pain due to trauma: Secondary | ICD-10-CM | POA: Diagnosis not present

## 2017-01-09 DIAGNOSIS — G8911 Acute pain due to trauma: Secondary | ICD-10-CM | POA: Diagnosis not present

## 2017-01-09 DIAGNOSIS — M6281 Muscle weakness (generalized): Secondary | ICD-10-CM | POA: Diagnosis not present

## 2017-01-09 DIAGNOSIS — R269 Unspecified abnormalities of gait and mobility: Secondary | ICD-10-CM | POA: Diagnosis not present

## 2017-01-11 DIAGNOSIS — R269 Unspecified abnormalities of gait and mobility: Secondary | ICD-10-CM | POA: Diagnosis not present

## 2017-01-11 DIAGNOSIS — M6281 Muscle weakness (generalized): Secondary | ICD-10-CM | POA: Diagnosis not present

## 2017-01-11 DIAGNOSIS — G8911 Acute pain due to trauma: Secondary | ICD-10-CM | POA: Diagnosis not present

## 2017-01-14 DIAGNOSIS — M6281 Muscle weakness (generalized): Secondary | ICD-10-CM | POA: Diagnosis not present

## 2017-01-14 DIAGNOSIS — G8911 Acute pain due to trauma: Secondary | ICD-10-CM | POA: Diagnosis not present

## 2017-01-14 DIAGNOSIS — R269 Unspecified abnormalities of gait and mobility: Secondary | ICD-10-CM | POA: Diagnosis not present

## 2017-01-16 DIAGNOSIS — R269 Unspecified abnormalities of gait and mobility: Secondary | ICD-10-CM | POA: Diagnosis not present

## 2017-01-16 DIAGNOSIS — M6281 Muscle weakness (generalized): Secondary | ICD-10-CM | POA: Diagnosis not present

## 2017-01-16 DIAGNOSIS — G8911 Acute pain due to trauma: Secondary | ICD-10-CM | POA: Diagnosis not present

## 2017-01-17 DIAGNOSIS — Z87891 Personal history of nicotine dependence: Secondary | ICD-10-CM | POA: Diagnosis not present

## 2017-01-17 DIAGNOSIS — Z951 Presence of aortocoronary bypass graft: Secondary | ICD-10-CM | POA: Diagnosis not present

## 2017-01-17 DIAGNOSIS — S50901D Unspecified superficial injury of right elbow, subsequent encounter: Secondary | ICD-10-CM | POA: Diagnosis not present

## 2017-01-17 DIAGNOSIS — I2510 Atherosclerotic heart disease of native coronary artery without angina pectoris: Secondary | ICD-10-CM | POA: Diagnosis not present

## 2017-01-17 DIAGNOSIS — I110 Hypertensive heart disease with heart failure: Secondary | ICD-10-CM | POA: Diagnosis not present

## 2017-01-17 DIAGNOSIS — M150 Primary generalized (osteo)arthritis: Secondary | ICD-10-CM | POA: Diagnosis not present

## 2017-01-17 DIAGNOSIS — S80911D Unspecified superficial injury of right knee, subsequent encounter: Secondary | ICD-10-CM | POA: Diagnosis not present

## 2017-01-17 DIAGNOSIS — F0390 Unspecified dementia without behavioral disturbance: Secondary | ICD-10-CM | POA: Diagnosis not present

## 2017-01-17 DIAGNOSIS — S50911D Unspecified superficial injury of right forearm, subsequent encounter: Secondary | ICD-10-CM | POA: Diagnosis not present

## 2017-01-17 DIAGNOSIS — G629 Polyneuropathy, unspecified: Secondary | ICD-10-CM | POA: Diagnosis not present

## 2017-01-17 DIAGNOSIS — I5042 Chronic combined systolic (congestive) and diastolic (congestive) heart failure: Secondary | ICD-10-CM | POA: Diagnosis not present

## 2017-01-17 DIAGNOSIS — Z9181 History of falling: Secondary | ICD-10-CM | POA: Diagnosis not present

## 2017-01-18 DIAGNOSIS — S80911D Unspecified superficial injury of right knee, subsequent encounter: Secondary | ICD-10-CM | POA: Diagnosis not present

## 2017-01-18 DIAGNOSIS — I5042 Chronic combined systolic (congestive) and diastolic (congestive) heart failure: Secondary | ICD-10-CM | POA: Diagnosis not present

## 2017-01-18 DIAGNOSIS — S50901D Unspecified superficial injury of right elbow, subsequent encounter: Secondary | ICD-10-CM | POA: Diagnosis not present

## 2017-01-18 DIAGNOSIS — F0390 Unspecified dementia without behavioral disturbance: Secondary | ICD-10-CM | POA: Diagnosis not present

## 2017-01-18 DIAGNOSIS — S50911D Unspecified superficial injury of right forearm, subsequent encounter: Secondary | ICD-10-CM | POA: Diagnosis not present

## 2017-01-18 DIAGNOSIS — I110 Hypertensive heart disease with heart failure: Secondary | ICD-10-CM | POA: Diagnosis not present

## 2017-01-21 DIAGNOSIS — I110 Hypertensive heart disease with heart failure: Secondary | ICD-10-CM | POA: Diagnosis not present

## 2017-01-21 DIAGNOSIS — S80911D Unspecified superficial injury of right knee, subsequent encounter: Secondary | ICD-10-CM | POA: Diagnosis not present

## 2017-01-21 DIAGNOSIS — S50901D Unspecified superficial injury of right elbow, subsequent encounter: Secondary | ICD-10-CM | POA: Diagnosis not present

## 2017-01-21 DIAGNOSIS — F0390 Unspecified dementia without behavioral disturbance: Secondary | ICD-10-CM | POA: Diagnosis not present

## 2017-01-21 DIAGNOSIS — S50911D Unspecified superficial injury of right forearm, subsequent encounter: Secondary | ICD-10-CM | POA: Diagnosis not present

## 2017-01-21 DIAGNOSIS — I5042 Chronic combined systolic (congestive) and diastolic (congestive) heart failure: Secondary | ICD-10-CM | POA: Diagnosis not present

## 2017-01-23 DIAGNOSIS — I110 Hypertensive heart disease with heart failure: Secondary | ICD-10-CM | POA: Diagnosis not present

## 2017-01-23 DIAGNOSIS — S80911D Unspecified superficial injury of right knee, subsequent encounter: Secondary | ICD-10-CM | POA: Diagnosis not present

## 2017-01-23 DIAGNOSIS — I5042 Chronic combined systolic (congestive) and diastolic (congestive) heart failure: Secondary | ICD-10-CM | POA: Diagnosis not present

## 2017-01-23 DIAGNOSIS — S50901D Unspecified superficial injury of right elbow, subsequent encounter: Secondary | ICD-10-CM | POA: Diagnosis not present

## 2017-01-23 DIAGNOSIS — S50911D Unspecified superficial injury of right forearm, subsequent encounter: Secondary | ICD-10-CM | POA: Diagnosis not present

## 2017-01-23 DIAGNOSIS — F0390 Unspecified dementia without behavioral disturbance: Secondary | ICD-10-CM | POA: Diagnosis not present

## 2017-01-25 DIAGNOSIS — F0390 Unspecified dementia without behavioral disturbance: Secondary | ICD-10-CM | POA: Diagnosis not present

## 2017-01-25 DIAGNOSIS — I110 Hypertensive heart disease with heart failure: Secondary | ICD-10-CM | POA: Diagnosis not present

## 2017-01-25 DIAGNOSIS — S80911D Unspecified superficial injury of right knee, subsequent encounter: Secondary | ICD-10-CM | POA: Diagnosis not present

## 2017-01-25 DIAGNOSIS — I5042 Chronic combined systolic (congestive) and diastolic (congestive) heart failure: Secondary | ICD-10-CM | POA: Diagnosis not present

## 2017-01-25 DIAGNOSIS — S50901D Unspecified superficial injury of right elbow, subsequent encounter: Secondary | ICD-10-CM | POA: Diagnosis not present

## 2017-01-25 DIAGNOSIS — S50911D Unspecified superficial injury of right forearm, subsequent encounter: Secondary | ICD-10-CM | POA: Diagnosis not present

## 2017-01-27 DIAGNOSIS — Z85038 Personal history of other malignant neoplasm of large intestine: Secondary | ICD-10-CM | POA: Diagnosis not present

## 2017-01-27 DIAGNOSIS — M25532 Pain in left wrist: Secondary | ICD-10-CM | POA: Diagnosis not present

## 2017-01-27 DIAGNOSIS — S52612A Displaced fracture of left ulna styloid process, initial encounter for closed fracture: Secondary | ICD-10-CM | POA: Diagnosis not present

## 2017-01-27 DIAGNOSIS — S52502A Unspecified fracture of the lower end of left radius, initial encounter for closed fracture: Secondary | ICD-10-CM | POA: Diagnosis not present

## 2017-01-27 DIAGNOSIS — X509XXA Other and unspecified overexertion or strenuous movements or postures, initial encounter: Secondary | ICD-10-CM | POA: Diagnosis not present

## 2017-01-27 DIAGNOSIS — I509 Heart failure, unspecified: Secondary | ICD-10-CM | POA: Diagnosis not present

## 2017-01-27 DIAGNOSIS — I110 Hypertensive heart disease with heart failure: Secondary | ICD-10-CM | POA: Diagnosis not present

## 2017-01-27 DIAGNOSIS — S62102A Fracture of unspecified carpal bone, left wrist, initial encounter for closed fracture: Secondary | ICD-10-CM | POA: Diagnosis not present

## 2017-01-27 DIAGNOSIS — Y929 Unspecified place or not applicable: Secondary | ICD-10-CM | POA: Diagnosis not present

## 2017-01-27 DIAGNOSIS — M25432 Effusion, left wrist: Secondary | ICD-10-CM | POA: Diagnosis not present

## 2017-01-27 DIAGNOSIS — I252 Old myocardial infarction: Secondary | ICD-10-CM | POA: Diagnosis not present

## 2017-01-27 DIAGNOSIS — Z951 Presence of aortocoronary bypass graft: Secondary | ICD-10-CM | POA: Diagnosis not present

## 2017-01-27 DIAGNOSIS — Z87891 Personal history of nicotine dependence: Secondary | ICD-10-CM | POA: Diagnosis not present

## 2017-01-27 DIAGNOSIS — Z85828 Personal history of other malignant neoplasm of skin: Secondary | ICD-10-CM | POA: Diagnosis not present

## 2017-01-28 DIAGNOSIS — F0390 Unspecified dementia without behavioral disturbance: Secondary | ICD-10-CM | POA: Diagnosis not present

## 2017-01-28 DIAGNOSIS — I110 Hypertensive heart disease with heart failure: Secondary | ICD-10-CM | POA: Diagnosis not present

## 2017-01-28 DIAGNOSIS — S80911D Unspecified superficial injury of right knee, subsequent encounter: Secondary | ICD-10-CM | POA: Diagnosis not present

## 2017-01-28 DIAGNOSIS — S50911D Unspecified superficial injury of right forearm, subsequent encounter: Secondary | ICD-10-CM | POA: Diagnosis not present

## 2017-01-28 DIAGNOSIS — I5042 Chronic combined systolic (congestive) and diastolic (congestive) heart failure: Secondary | ICD-10-CM | POA: Diagnosis not present

## 2017-01-28 DIAGNOSIS — S50901D Unspecified superficial injury of right elbow, subsequent encounter: Secondary | ICD-10-CM | POA: Diagnosis not present

## 2017-01-29 DIAGNOSIS — S50911D Unspecified superficial injury of right forearm, subsequent encounter: Secondary | ICD-10-CM | POA: Diagnosis not present

## 2017-01-29 DIAGNOSIS — S50901D Unspecified superficial injury of right elbow, subsequent encounter: Secondary | ICD-10-CM | POA: Diagnosis not present

## 2017-01-29 DIAGNOSIS — I110 Hypertensive heart disease with heart failure: Secondary | ICD-10-CM | POA: Diagnosis not present

## 2017-01-29 DIAGNOSIS — F0390 Unspecified dementia without behavioral disturbance: Secondary | ICD-10-CM | POA: Diagnosis not present

## 2017-01-29 DIAGNOSIS — I5042 Chronic combined systolic (congestive) and diastolic (congestive) heart failure: Secondary | ICD-10-CM | POA: Diagnosis not present

## 2017-01-29 DIAGNOSIS — S80911D Unspecified superficial injury of right knee, subsequent encounter: Secondary | ICD-10-CM | POA: Diagnosis not present

## 2017-01-30 DIAGNOSIS — S50911D Unspecified superficial injury of right forearm, subsequent encounter: Secondary | ICD-10-CM | POA: Diagnosis not present

## 2017-01-30 DIAGNOSIS — S80911D Unspecified superficial injury of right knee, subsequent encounter: Secondary | ICD-10-CM | POA: Diagnosis not present

## 2017-01-30 DIAGNOSIS — S50901D Unspecified superficial injury of right elbow, subsequent encounter: Secondary | ICD-10-CM | POA: Diagnosis not present

## 2017-01-30 DIAGNOSIS — I5042 Chronic combined systolic (congestive) and diastolic (congestive) heart failure: Secondary | ICD-10-CM | POA: Diagnosis not present

## 2017-01-30 DIAGNOSIS — S52592A Other fractures of lower end of left radius, initial encounter for closed fracture: Secondary | ICD-10-CM | POA: Diagnosis not present

## 2017-01-30 DIAGNOSIS — F0390 Unspecified dementia without behavioral disturbance: Secondary | ICD-10-CM | POA: Diagnosis not present

## 2017-01-30 DIAGNOSIS — I110 Hypertensive heart disease with heart failure: Secondary | ICD-10-CM | POA: Diagnosis not present

## 2017-01-31 DIAGNOSIS — I5042 Chronic combined systolic (congestive) and diastolic (congestive) heart failure: Secondary | ICD-10-CM | POA: Diagnosis not present

## 2017-01-31 DIAGNOSIS — F0390 Unspecified dementia without behavioral disturbance: Secondary | ICD-10-CM | POA: Diagnosis not present

## 2017-01-31 DIAGNOSIS — S50901D Unspecified superficial injury of right elbow, subsequent encounter: Secondary | ICD-10-CM | POA: Diagnosis not present

## 2017-01-31 DIAGNOSIS — S50911D Unspecified superficial injury of right forearm, subsequent encounter: Secondary | ICD-10-CM | POA: Diagnosis not present

## 2017-01-31 DIAGNOSIS — I110 Hypertensive heart disease with heart failure: Secondary | ICD-10-CM | POA: Diagnosis not present

## 2017-01-31 DIAGNOSIS — S80911D Unspecified superficial injury of right knee, subsequent encounter: Secondary | ICD-10-CM | POA: Diagnosis not present

## 2017-02-01 DIAGNOSIS — S50911D Unspecified superficial injury of right forearm, subsequent encounter: Secondary | ICD-10-CM | POA: Diagnosis not present

## 2017-02-01 DIAGNOSIS — S50901D Unspecified superficial injury of right elbow, subsequent encounter: Secondary | ICD-10-CM | POA: Diagnosis not present

## 2017-02-01 DIAGNOSIS — I110 Hypertensive heart disease with heart failure: Secondary | ICD-10-CM | POA: Diagnosis not present

## 2017-02-01 DIAGNOSIS — I5042 Chronic combined systolic (congestive) and diastolic (congestive) heart failure: Secondary | ICD-10-CM | POA: Diagnosis not present

## 2017-02-01 DIAGNOSIS — F0390 Unspecified dementia without behavioral disturbance: Secondary | ICD-10-CM | POA: Diagnosis not present

## 2017-02-01 DIAGNOSIS — S80911D Unspecified superficial injury of right knee, subsequent encounter: Secondary | ICD-10-CM | POA: Diagnosis not present

## 2017-02-04 DIAGNOSIS — S50911D Unspecified superficial injury of right forearm, subsequent encounter: Secondary | ICD-10-CM | POA: Diagnosis not present

## 2017-02-04 DIAGNOSIS — I5042 Chronic combined systolic (congestive) and diastolic (congestive) heart failure: Secondary | ICD-10-CM | POA: Diagnosis not present

## 2017-02-04 DIAGNOSIS — F0390 Unspecified dementia without behavioral disturbance: Secondary | ICD-10-CM | POA: Diagnosis not present

## 2017-02-04 DIAGNOSIS — I110 Hypertensive heart disease with heart failure: Secondary | ICD-10-CM | POA: Diagnosis not present

## 2017-02-04 DIAGNOSIS — S80911D Unspecified superficial injury of right knee, subsequent encounter: Secondary | ICD-10-CM | POA: Diagnosis not present

## 2017-02-04 DIAGNOSIS — S50901D Unspecified superficial injury of right elbow, subsequent encounter: Secondary | ICD-10-CM | POA: Diagnosis not present

## 2017-02-06 DIAGNOSIS — S52592A Other fractures of lower end of left radius, initial encounter for closed fracture: Secondary | ICD-10-CM | POA: Diagnosis not present

## 2017-02-06 DIAGNOSIS — I5042 Chronic combined systolic (congestive) and diastolic (congestive) heart failure: Secondary | ICD-10-CM | POA: Diagnosis not present

## 2017-02-06 DIAGNOSIS — F0390 Unspecified dementia without behavioral disturbance: Secondary | ICD-10-CM | POA: Diagnosis not present

## 2017-02-06 DIAGNOSIS — S50901D Unspecified superficial injury of right elbow, subsequent encounter: Secondary | ICD-10-CM | POA: Diagnosis not present

## 2017-02-06 DIAGNOSIS — S50911D Unspecified superficial injury of right forearm, subsequent encounter: Secondary | ICD-10-CM | POA: Diagnosis not present

## 2017-02-06 DIAGNOSIS — I110 Hypertensive heart disease with heart failure: Secondary | ICD-10-CM | POA: Diagnosis not present

## 2017-02-06 DIAGNOSIS — S80911D Unspecified superficial injury of right knee, subsequent encounter: Secondary | ICD-10-CM | POA: Diagnosis not present

## 2017-02-08 DIAGNOSIS — I5042 Chronic combined systolic (congestive) and diastolic (congestive) heart failure: Secondary | ICD-10-CM | POA: Diagnosis not present

## 2017-02-08 DIAGNOSIS — S80911D Unspecified superficial injury of right knee, subsequent encounter: Secondary | ICD-10-CM | POA: Diagnosis not present

## 2017-02-08 DIAGNOSIS — S50911D Unspecified superficial injury of right forearm, subsequent encounter: Secondary | ICD-10-CM | POA: Diagnosis not present

## 2017-02-08 DIAGNOSIS — I110 Hypertensive heart disease with heart failure: Secondary | ICD-10-CM | POA: Diagnosis not present

## 2017-02-08 DIAGNOSIS — F0390 Unspecified dementia without behavioral disturbance: Secondary | ICD-10-CM | POA: Diagnosis not present

## 2017-02-08 DIAGNOSIS — S50901D Unspecified superficial injury of right elbow, subsequent encounter: Secondary | ICD-10-CM | POA: Diagnosis not present

## 2017-02-11 DIAGNOSIS — S50911D Unspecified superficial injury of right forearm, subsequent encounter: Secondary | ICD-10-CM | POA: Diagnosis not present

## 2017-02-11 DIAGNOSIS — S80911D Unspecified superficial injury of right knee, subsequent encounter: Secondary | ICD-10-CM | POA: Diagnosis not present

## 2017-02-11 DIAGNOSIS — I110 Hypertensive heart disease with heart failure: Secondary | ICD-10-CM | POA: Diagnosis not present

## 2017-02-11 DIAGNOSIS — S50901D Unspecified superficial injury of right elbow, subsequent encounter: Secondary | ICD-10-CM | POA: Diagnosis not present

## 2017-02-11 DIAGNOSIS — F0390 Unspecified dementia without behavioral disturbance: Secondary | ICD-10-CM | POA: Diagnosis not present

## 2017-02-11 DIAGNOSIS — I5042 Chronic combined systolic (congestive) and diastolic (congestive) heart failure: Secondary | ICD-10-CM | POA: Diagnosis not present

## 2017-02-12 DIAGNOSIS — S80911D Unspecified superficial injury of right knee, subsequent encounter: Secondary | ICD-10-CM | POA: Diagnosis not present

## 2017-02-12 DIAGNOSIS — S50901D Unspecified superficial injury of right elbow, subsequent encounter: Secondary | ICD-10-CM | POA: Diagnosis not present

## 2017-02-12 DIAGNOSIS — S50911D Unspecified superficial injury of right forearm, subsequent encounter: Secondary | ICD-10-CM | POA: Diagnosis not present

## 2017-02-12 DIAGNOSIS — I110 Hypertensive heart disease with heart failure: Secondary | ICD-10-CM | POA: Diagnosis not present

## 2017-02-13 DIAGNOSIS — S50911D Unspecified superficial injury of right forearm, subsequent encounter: Secondary | ICD-10-CM | POA: Diagnosis not present

## 2017-02-13 DIAGNOSIS — I5042 Chronic combined systolic (congestive) and diastolic (congestive) heart failure: Secondary | ICD-10-CM | POA: Diagnosis not present

## 2017-02-13 DIAGNOSIS — S50901D Unspecified superficial injury of right elbow, subsequent encounter: Secondary | ICD-10-CM | POA: Diagnosis not present

## 2017-02-13 DIAGNOSIS — I110 Hypertensive heart disease with heart failure: Secondary | ICD-10-CM | POA: Diagnosis not present

## 2017-02-13 DIAGNOSIS — F0390 Unspecified dementia without behavioral disturbance: Secondary | ICD-10-CM | POA: Diagnosis not present

## 2017-02-13 DIAGNOSIS — S80911D Unspecified superficial injury of right knee, subsequent encounter: Secondary | ICD-10-CM | POA: Diagnosis not present

## 2017-02-15 DIAGNOSIS — S50901D Unspecified superficial injury of right elbow, subsequent encounter: Secondary | ICD-10-CM | POA: Diagnosis not present

## 2017-02-15 DIAGNOSIS — F0390 Unspecified dementia without behavioral disturbance: Secondary | ICD-10-CM | POA: Diagnosis not present

## 2017-02-15 DIAGNOSIS — S50911D Unspecified superficial injury of right forearm, subsequent encounter: Secondary | ICD-10-CM | POA: Diagnosis not present

## 2017-02-15 DIAGNOSIS — S80911D Unspecified superficial injury of right knee, subsequent encounter: Secondary | ICD-10-CM | POA: Diagnosis not present

## 2017-02-15 DIAGNOSIS — I110 Hypertensive heart disease with heart failure: Secondary | ICD-10-CM | POA: Diagnosis not present

## 2017-02-15 DIAGNOSIS — I5042 Chronic combined systolic (congestive) and diastolic (congestive) heart failure: Secondary | ICD-10-CM | POA: Diagnosis not present

## 2017-02-18 DIAGNOSIS — S50901D Unspecified superficial injury of right elbow, subsequent encounter: Secondary | ICD-10-CM | POA: Diagnosis not present

## 2017-02-18 DIAGNOSIS — S80911D Unspecified superficial injury of right knee, subsequent encounter: Secondary | ICD-10-CM | POA: Diagnosis not present

## 2017-02-18 DIAGNOSIS — S50911D Unspecified superficial injury of right forearm, subsequent encounter: Secondary | ICD-10-CM | POA: Diagnosis not present

## 2017-02-18 DIAGNOSIS — I5042 Chronic combined systolic (congestive) and diastolic (congestive) heart failure: Secondary | ICD-10-CM | POA: Diagnosis not present

## 2017-02-18 DIAGNOSIS — S52592D Other fractures of lower end of left radius, subsequent encounter for closed fracture with routine healing: Secondary | ICD-10-CM | POA: Diagnosis not present

## 2017-02-18 DIAGNOSIS — F0390 Unspecified dementia without behavioral disturbance: Secondary | ICD-10-CM | POA: Diagnosis not present

## 2017-02-18 DIAGNOSIS — I110 Hypertensive heart disease with heart failure: Secondary | ICD-10-CM | POA: Diagnosis not present

## 2017-02-20 DIAGNOSIS — F0390 Unspecified dementia without behavioral disturbance: Secondary | ICD-10-CM | POA: Diagnosis not present

## 2017-02-20 DIAGNOSIS — Z08 Encounter for follow-up examination after completed treatment for malignant neoplasm: Secondary | ICD-10-CM | POA: Diagnosis not present

## 2017-02-20 DIAGNOSIS — S50911D Unspecified superficial injury of right forearm, subsequent encounter: Secondary | ICD-10-CM | POA: Diagnosis not present

## 2017-02-20 DIAGNOSIS — S50901D Unspecified superficial injury of right elbow, subsequent encounter: Secondary | ICD-10-CM | POA: Diagnosis not present

## 2017-02-20 DIAGNOSIS — H353223 Exudative age-related macular degeneration, left eye, with inactive scar: Secondary | ICD-10-CM | POA: Diagnosis not present

## 2017-02-20 DIAGNOSIS — S80911D Unspecified superficial injury of right knee, subsequent encounter: Secondary | ICD-10-CM | POA: Diagnosis not present

## 2017-02-20 DIAGNOSIS — Z85828 Personal history of other malignant neoplasm of skin: Secondary | ICD-10-CM | POA: Diagnosis not present

## 2017-02-20 DIAGNOSIS — I5042 Chronic combined systolic (congestive) and diastolic (congestive) heart failure: Secondary | ICD-10-CM | POA: Diagnosis not present

## 2017-02-20 DIAGNOSIS — H353211 Exudative age-related macular degeneration, right eye, with active choroidal neovascularization: Secondary | ICD-10-CM | POA: Diagnosis not present

## 2017-02-20 DIAGNOSIS — L821 Other seborrheic keratosis: Secondary | ICD-10-CM | POA: Diagnosis not present

## 2017-02-20 DIAGNOSIS — I110 Hypertensive heart disease with heart failure: Secondary | ICD-10-CM | POA: Diagnosis not present

## 2017-02-22 DIAGNOSIS — F0390 Unspecified dementia without behavioral disturbance: Secondary | ICD-10-CM | POA: Diagnosis not present

## 2017-02-22 DIAGNOSIS — I110 Hypertensive heart disease with heart failure: Secondary | ICD-10-CM | POA: Diagnosis not present

## 2017-02-22 DIAGNOSIS — S50901D Unspecified superficial injury of right elbow, subsequent encounter: Secondary | ICD-10-CM | POA: Diagnosis not present

## 2017-02-22 DIAGNOSIS — S80911D Unspecified superficial injury of right knee, subsequent encounter: Secondary | ICD-10-CM | POA: Diagnosis not present

## 2017-02-22 DIAGNOSIS — S50911D Unspecified superficial injury of right forearm, subsequent encounter: Secondary | ICD-10-CM | POA: Diagnosis not present

## 2017-02-22 DIAGNOSIS — I5042 Chronic combined systolic (congestive) and diastolic (congestive) heart failure: Secondary | ICD-10-CM | POA: Diagnosis not present

## 2017-02-24 DIAGNOSIS — Z23 Encounter for immunization: Secondary | ICD-10-CM | POA: Diagnosis not present

## 2017-02-25 DIAGNOSIS — S50901D Unspecified superficial injury of right elbow, subsequent encounter: Secondary | ICD-10-CM | POA: Diagnosis not present

## 2017-02-25 DIAGNOSIS — S50911D Unspecified superficial injury of right forearm, subsequent encounter: Secondary | ICD-10-CM | POA: Diagnosis not present

## 2017-02-25 DIAGNOSIS — S80911D Unspecified superficial injury of right knee, subsequent encounter: Secondary | ICD-10-CM | POA: Diagnosis not present

## 2017-02-25 DIAGNOSIS — I5042 Chronic combined systolic (congestive) and diastolic (congestive) heart failure: Secondary | ICD-10-CM | POA: Diagnosis not present

## 2017-02-25 DIAGNOSIS — I110 Hypertensive heart disease with heart failure: Secondary | ICD-10-CM | POA: Diagnosis not present

## 2017-02-25 DIAGNOSIS — F0390 Unspecified dementia without behavioral disturbance: Secondary | ICD-10-CM | POA: Diagnosis not present

## 2017-02-27 DIAGNOSIS — I5042 Chronic combined systolic (congestive) and diastolic (congestive) heart failure: Secondary | ICD-10-CM | POA: Diagnosis not present

## 2017-02-27 DIAGNOSIS — F0390 Unspecified dementia without behavioral disturbance: Secondary | ICD-10-CM | POA: Diagnosis not present

## 2017-02-27 DIAGNOSIS — I110 Hypertensive heart disease with heart failure: Secondary | ICD-10-CM | POA: Diagnosis not present

## 2017-02-27 DIAGNOSIS — S80911D Unspecified superficial injury of right knee, subsequent encounter: Secondary | ICD-10-CM | POA: Diagnosis not present

## 2017-02-27 DIAGNOSIS — S50911D Unspecified superficial injury of right forearm, subsequent encounter: Secondary | ICD-10-CM | POA: Diagnosis not present

## 2017-02-27 DIAGNOSIS — S50901D Unspecified superficial injury of right elbow, subsequent encounter: Secondary | ICD-10-CM | POA: Diagnosis not present

## 2017-03-01 DIAGNOSIS — S50901D Unspecified superficial injury of right elbow, subsequent encounter: Secondary | ICD-10-CM | POA: Diagnosis not present

## 2017-03-01 DIAGNOSIS — F0390 Unspecified dementia without behavioral disturbance: Secondary | ICD-10-CM | POA: Diagnosis not present

## 2017-03-01 DIAGNOSIS — I110 Hypertensive heart disease with heart failure: Secondary | ICD-10-CM | POA: Diagnosis not present

## 2017-03-01 DIAGNOSIS — I5042 Chronic combined systolic (congestive) and diastolic (congestive) heart failure: Secondary | ICD-10-CM | POA: Diagnosis not present

## 2017-03-01 DIAGNOSIS — S50911D Unspecified superficial injury of right forearm, subsequent encounter: Secondary | ICD-10-CM | POA: Diagnosis not present

## 2017-03-01 DIAGNOSIS — S80911D Unspecified superficial injury of right knee, subsequent encounter: Secondary | ICD-10-CM | POA: Diagnosis not present

## 2017-03-04 DIAGNOSIS — S50901D Unspecified superficial injury of right elbow, subsequent encounter: Secondary | ICD-10-CM | POA: Diagnosis not present

## 2017-03-04 DIAGNOSIS — S50911D Unspecified superficial injury of right forearm, subsequent encounter: Secondary | ICD-10-CM | POA: Diagnosis not present

## 2017-03-04 DIAGNOSIS — F0390 Unspecified dementia without behavioral disturbance: Secondary | ICD-10-CM | POA: Diagnosis not present

## 2017-03-04 DIAGNOSIS — S80911D Unspecified superficial injury of right knee, subsequent encounter: Secondary | ICD-10-CM | POA: Diagnosis not present

## 2017-03-04 DIAGNOSIS — I110 Hypertensive heart disease with heart failure: Secondary | ICD-10-CM | POA: Diagnosis not present

## 2017-03-04 DIAGNOSIS — I5042 Chronic combined systolic (congestive) and diastolic (congestive) heart failure: Secondary | ICD-10-CM | POA: Diagnosis not present

## 2017-03-06 DIAGNOSIS — B351 Tinea unguium: Secondary | ICD-10-CM | POA: Diagnosis not present

## 2017-03-06 DIAGNOSIS — G6289 Other specified polyneuropathies: Secondary | ICD-10-CM | POA: Diagnosis not present

## 2017-03-06 DIAGNOSIS — M150 Primary generalized (osteo)arthritis: Secondary | ICD-10-CM | POA: Diagnosis not present

## 2017-03-06 DIAGNOSIS — H3530 Unspecified macular degeneration: Secondary | ICD-10-CM | POA: Diagnosis not present

## 2017-03-08 DIAGNOSIS — S80911D Unspecified superficial injury of right knee, subsequent encounter: Secondary | ICD-10-CM | POA: Diagnosis not present

## 2017-03-08 DIAGNOSIS — I110 Hypertensive heart disease with heart failure: Secondary | ICD-10-CM | POA: Diagnosis not present

## 2017-03-08 DIAGNOSIS — I5042 Chronic combined systolic (congestive) and diastolic (congestive) heart failure: Secondary | ICD-10-CM | POA: Diagnosis not present

## 2017-03-08 DIAGNOSIS — F0390 Unspecified dementia without behavioral disturbance: Secondary | ICD-10-CM | POA: Diagnosis not present

## 2017-03-08 DIAGNOSIS — S50911D Unspecified superficial injury of right forearm, subsequent encounter: Secondary | ICD-10-CM | POA: Diagnosis not present

## 2017-03-08 DIAGNOSIS — S50901D Unspecified superficial injury of right elbow, subsequent encounter: Secondary | ICD-10-CM | POA: Diagnosis not present

## 2017-03-11 DIAGNOSIS — I5042 Chronic combined systolic (congestive) and diastolic (congestive) heart failure: Secondary | ICD-10-CM | POA: Diagnosis not present

## 2017-03-11 DIAGNOSIS — S50911D Unspecified superficial injury of right forearm, subsequent encounter: Secondary | ICD-10-CM | POA: Diagnosis not present

## 2017-03-11 DIAGNOSIS — I110 Hypertensive heart disease with heart failure: Secondary | ICD-10-CM | POA: Diagnosis not present

## 2017-03-11 DIAGNOSIS — S80911D Unspecified superficial injury of right knee, subsequent encounter: Secondary | ICD-10-CM | POA: Diagnosis not present

## 2017-03-11 DIAGNOSIS — F0390 Unspecified dementia without behavioral disturbance: Secondary | ICD-10-CM | POA: Diagnosis not present

## 2017-03-11 DIAGNOSIS — S50901D Unspecified superficial injury of right elbow, subsequent encounter: Secondary | ICD-10-CM | POA: Diagnosis not present

## 2017-03-11 DIAGNOSIS — S52592A Other fractures of lower end of left radius, initial encounter for closed fracture: Secondary | ICD-10-CM | POA: Diagnosis not present

## 2017-03-14 DIAGNOSIS — S50911D Unspecified superficial injury of right forearm, subsequent encounter: Secondary | ICD-10-CM | POA: Diagnosis not present

## 2017-03-14 DIAGNOSIS — S50901D Unspecified superficial injury of right elbow, subsequent encounter: Secondary | ICD-10-CM | POA: Diagnosis not present

## 2017-03-14 DIAGNOSIS — F0390 Unspecified dementia without behavioral disturbance: Secondary | ICD-10-CM | POA: Diagnosis not present

## 2017-03-14 DIAGNOSIS — S80911D Unspecified superficial injury of right knee, subsequent encounter: Secondary | ICD-10-CM | POA: Diagnosis not present

## 2017-03-14 DIAGNOSIS — I110 Hypertensive heart disease with heart failure: Secondary | ICD-10-CM | POA: Diagnosis not present

## 2017-03-14 DIAGNOSIS — I5042 Chronic combined systolic (congestive) and diastolic (congestive) heart failure: Secondary | ICD-10-CM | POA: Diagnosis not present

## 2017-03-15 DIAGNOSIS — I10 Essential (primary) hypertension: Secondary | ICD-10-CM | POA: Diagnosis not present

## 2017-03-15 DIAGNOSIS — I5042 Chronic combined systolic (congestive) and diastolic (congestive) heart failure: Secondary | ICD-10-CM | POA: Diagnosis not present

## 2017-03-15 DIAGNOSIS — E039 Hypothyroidism, unspecified: Secondary | ICD-10-CM | POA: Diagnosis not present

## 2017-03-15 DIAGNOSIS — S62102G Fracture of unspecified carpal bone, left wrist, subsequent encounter for fracture with delayed healing: Secondary | ICD-10-CM | POA: Diagnosis not present

## 2017-04-01 DIAGNOSIS — S52592A Other fractures of lower end of left radius, initial encounter for closed fracture: Secondary | ICD-10-CM | POA: Diagnosis not present

## 2017-10-28 DEATH — deceased
